# Patient Record
Sex: Male | Born: 1975 | Race: Black or African American | Hispanic: No | Marital: Married | Smoking: Never smoker
Health system: Southern US, Community
[De-identification: ages and names within clinical notes are randomized; demographics above are authoritative.]

## PROBLEM LIST (undated history)

## (undated) DIAGNOSIS — Z789 Other specified health status: Secondary | ICD-10-CM

## (undated) DIAGNOSIS — I10 Essential (primary) hypertension: Secondary | ICD-10-CM

## (undated) DIAGNOSIS — I5042 Chronic combined systolic (congestive) and diastolic (congestive) heart failure: Secondary | ICD-10-CM

## (undated) HISTORY — PX: NO PAST SURGERIES: SHX2092

---

## 2004-05-12 ENCOUNTER — Emergency Department: Payer: Self-pay | Admitting: Internal Medicine

## 2013-03-05 ENCOUNTER — Emergency Department: Payer: Self-pay | Admitting: Emergency Medicine

## 2013-03-08 LAB — BETA STREP CULTURE(ARMC)

## 2014-04-04 ENCOUNTER — Emergency Department: Payer: Self-pay | Admitting: Emergency Medicine

## 2014-05-06 ENCOUNTER — Ambulatory Visit: Payer: Self-pay | Admitting: Family Medicine

## 2017-08-10 ENCOUNTER — Emergency Department: Payer: No Typology Code available for payment source

## 2017-08-10 ENCOUNTER — Other Ambulatory Visit: Payer: Self-pay

## 2017-08-10 ENCOUNTER — Emergency Department
Admission: EM | Admit: 2017-08-10 | Discharge: 2017-08-10 | Disposition: A | Discharge status: Home / Self Care | Payer: No Typology Code available for payment source | Attending: Emergency Medicine | Admitting: Emergency Medicine

## 2017-08-10 ENCOUNTER — Encounter: Payer: Self-pay | Admitting: *Deleted

## 2017-08-10 DIAGNOSIS — Y9389 Activity, other specified: Secondary | ICD-10-CM | POA: Insufficient documentation

## 2017-08-10 DIAGNOSIS — M542 Cervicalgia: Secondary | ICD-10-CM | POA: Insufficient documentation

## 2017-08-10 DIAGNOSIS — Y999 Unspecified external cause status: Secondary | ICD-10-CM | POA: Diagnosis not present

## 2017-08-10 DIAGNOSIS — Y92414 Local residential or business street as the place of occurrence of the external cause: Secondary | ICD-10-CM | POA: Insufficient documentation

## 2017-08-10 MED ORDER — CYCLOBENZAPRINE HCL 5 MG PO TABS: ORAL_TABLET | ORAL | 0 refills | Status: DC

## 2017-08-10 MED ORDER — IBUPROFEN 600 MG PO TABS: 600.0000 mg | ORAL_TABLET | Freq: Four times a day (QID) | ORAL | 0 refills | Status: DC | PRN
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
## 2019-11-19 ENCOUNTER — Other Ambulatory Visit: Payer: Self-pay

## 2019-11-19 ENCOUNTER — Other Ambulatory Visit: Payer: Self-pay | Admitting: Radiology

## 2019-11-19 DIAGNOSIS — Z20822 Contact with and (suspected) exposure to covid-19: Secondary | ICD-10-CM

## 2019-11-20 LAB — NOVEL CORONAVIRUS, NAA: SARS-CoV-2, NAA: NOT DETECTED

## 2019-11-20 LAB — SARS-COV-2, NAA 2 DAY TAT

## 2019-11-23 ENCOUNTER — Telehealth: Payer: Self-pay

## 2019-11-23 NOTE — Telephone Encounter (Signed)
This encounter was created in error - please disregard.

## 2019-11-23 NOTE — Telephone Encounter (Signed)
Called and informed patient that test for Covid 19 was NEGATIVE. Discussed signs and symptoms of Covid 19 : fever, chills, respiratory symptoms, cough, ENT symptoms, sore throat, SOB, muscle pain, diarrhea, headache, loss of taste/smell, close exposure to COVID-19 patient. Pt instructed to call PCP if they develop the above signs and sx. Pt also instructed to call 911 if having respiratory issues/distress. Discussed MyChart enrollment. Pt verbalized understanding.  

## 2023-05-07 ENCOUNTER — Other Ambulatory Visit: Payer: Self-pay | Admitting: Family Medicine

## 2023-05-07 DIAGNOSIS — I509 Heart failure, unspecified: Secondary | ICD-10-CM

## 2023-06-04 ENCOUNTER — Ambulatory Visit
Admission: RE | Admit: 2023-06-04 | Discharge: 2023-06-04 | Disposition: A | Discharge status: Home / Self Care | Payer: No Typology Code available for payment source | Source: Ambulatory Visit | Attending: Family Medicine | Admitting: Family Medicine

## 2023-06-04 DIAGNOSIS — I509 Heart failure, unspecified: Secondary | ICD-10-CM | POA: Insufficient documentation

## 2023-06-04 NOTE — Progress Notes (Signed)
*  PRELIMINARY RESULTS* Echocardiogram 2D Echocardiogram has been performed.  Connor Blake 06/04/2023, 9:21 AM

## 2023-06-05 LAB — ECHOCARDIOGRAM COMPLETE
Est EF: <20
S' Lateral: 4.8 cm

## 2023-06-21 ENCOUNTER — Ambulatory Visit
Admission: RE | Admit: 2023-06-21 | Discharge: 2023-06-21 | Disposition: A | Discharge status: Home / Self Care | Attending: Internal Medicine | Admitting: Internal Medicine

## 2023-06-21 ENCOUNTER — Encounter
Admission: RE | Disposition: A | Discharge status: Home / Self Care | Payer: Self-pay | Source: Home / Self Care | Attending: Internal Medicine

## 2023-06-21 DIAGNOSIS — Z539 Procedure and treatment not carried out, unspecified reason: Secondary | ICD-10-CM | POA: Diagnosis not present

## 2023-06-21 DIAGNOSIS — I5023 Acute on chronic systolic (congestive) heart failure: Secondary | ICD-10-CM | POA: Insufficient documentation

## 2023-06-21 SURGERY — RIGHT/LEFT HEART CATH AND CORONARY ANGIOGRAPHY
Anesthesia: Moderate Sedation | Laterality: Bilateral

## 2023-06-21 MED ORDER — SODIUM CHLORIDE 0.9 % IV SOLN: INTRAVENOUS | Status: DC

## 2023-06-21 MED ORDER — SODIUM CHLORIDE 0.9 % IV SOLN: 250.0000 mL | INTRAVENOUS | Status: DC | PRN

## 2023-06-21 MED ORDER — SODIUM CHLORIDE 0.9% FLUSH: 3.0000 mL | INTRAVENOUS | Status: DC | PRN

## 2023-06-21 MED ORDER — SODIUM CHLORIDE 0.9% FLUSH: 3.0000 mL | Freq: Two times a day (BID) | INTRAVENOUS | Status: DC

## 2023-06-21 MED ORDER — ASPIRIN 81 MG PO CHEW: 81.0000 mg | CHEWABLE_TABLET | ORAL | Status: DC

## 2023-06-28 ENCOUNTER — Ambulatory Visit
Admission: RE | Admit: 2023-06-28 | Discharge: 2023-06-28 | Disposition: A | Discharge status: Home / Self Care | Attending: Internal Medicine | Admitting: Internal Medicine

## 2023-06-28 ENCOUNTER — Encounter
Admission: RE | Disposition: A | Discharge status: Home / Self Care | Payer: Self-pay | Source: Home / Self Care | Attending: Internal Medicine

## 2023-06-28 ENCOUNTER — Encounter: Payer: Self-pay | Admitting: Internal Medicine

## 2023-06-28 DIAGNOSIS — I428 Other cardiomyopathies: Secondary | ICD-10-CM | POA: Diagnosis not present

## 2023-06-28 DIAGNOSIS — I5020 Unspecified systolic (congestive) heart failure: Secondary | ICD-10-CM | POA: Insufficient documentation

## 2023-06-28 HISTORY — PX: LEFT HEART CATH AND CORONARY ANGIOGRAPHY: CATH118249

## 2023-06-28 HISTORY — DX: Other specified health status: Z78.9

## 2023-06-28 LAB — CARDIAC CATHETERIZATION: Cath EF Quantitative: 25 %

## 2023-06-28 SURGERY — LEFT HEART CATH AND CORONARY ANGIOGRAPHY
Anesthesia: Moderate Sedation | Laterality: Left

## 2023-06-28 MED ORDER — HEPARIN SODIUM (PORCINE) 1000 UNIT/ML IJ SOLN
INTRAMUSCULAR | Status: DC | PRN
  Administered 2023-06-28: 5000 [IU] via INTRAVENOUS

## 2023-06-28 MED ORDER — SODIUM CHLORIDE 0.9 % IV SOLN: 250.0000 mL | INTRAVENOUS | Status: DC | PRN

## 2023-06-28 MED ORDER — LABETALOL HCL 5 MG/ML IV SOLN
INTRAVENOUS | Status: AC
Start: 2023-06-28 — End: ?
  Filled 2023-06-28: qty 4

## 2023-06-28 MED ORDER — SODIUM CHLORIDE 0.9% FLUSH: 3.0000 mL | INTRAVENOUS | Status: DC | PRN

## 2023-06-28 MED ORDER — VERAPAMIL HCL 2.5 MG/ML IV SOLN
INTRAVENOUS | Status: AC
  Filled 2023-06-28: qty 2

## 2023-06-28 MED ORDER — ASPIRIN 81 MG PO CHEW
CHEWABLE_TABLET | ORAL | Status: AC
  Filled 2023-06-28: qty 1

## 2023-06-28 MED ORDER — MIDAZOLAM HCL 2 MG/2ML IJ SOLN
INTRAMUSCULAR | Status: DC | PRN
  Administered 2023-06-28: 2 mg via INTRAVENOUS

## 2023-06-28 MED ORDER — SODIUM CHLORIDE 0.9 % IV SOLN
INTRAVENOUS | Status: DC
  Administered 2023-06-28: 250 mL via INTRAVENOUS

## 2023-06-28 MED ORDER — ACETAMINOPHEN 325 MG PO TABS: 650.0000 mg | ORAL_TABLET | ORAL | Status: DC | PRN

## 2023-06-28 MED ORDER — LABETALOL HCL 5 MG/ML IV SOLN
INTRAVENOUS | Status: DC | PRN
Start: 2023-06-28 — End: 2023-06-28
  Administered 2023-06-28 (×2): 10 mg via INTRAVENOUS

## 2023-06-28 MED ORDER — LABETALOL HCL 5 MG/ML IV SOLN
10.0000 mg | INTRAVENOUS | Status: DC | PRN
  Administered 2023-06-28: 10 mg via INTRAVENOUS

## 2023-06-28 MED ORDER — MIDAZOLAM HCL 2 MG/2ML IJ SOLN
INTRAMUSCULAR | Status: AC
  Filled 2023-06-28: qty 2

## 2023-06-28 MED ORDER — SODIUM CHLORIDE 0.9% FLUSH: 3.0000 mL | Freq: Two times a day (BID) | INTRAVENOUS | Status: DC

## 2023-06-28 MED ORDER — HEPARIN (PORCINE) IN NACL 1000-0.9 UT/500ML-% IV SOLN
INTRAVENOUS | Status: AC
  Filled 2023-06-28: qty 1000

## 2023-06-28 MED ORDER — ASPIRIN 81 MG PO CHEW
81.0000 mg | CHEWABLE_TABLET | ORAL | Status: AC
  Administered 2023-06-28: 81 mg via ORAL

## 2023-06-28 MED ORDER — FENTANYL CITRATE (PF) 100 MCG/2ML IJ SOLN
INTRAMUSCULAR | Status: AC
  Filled 2023-06-28: qty 2

## 2023-06-28 MED ORDER — IOHEXOL 300 MG/ML  SOLN
INTRAMUSCULAR | Status: DC | PRN
  Administered 2023-06-28: 340 mL

## 2023-06-28 MED ORDER — VERAPAMIL HCL 2.5 MG/ML IV SOLN
INTRAVENOUS | Status: DC | PRN
  Administered 2023-06-28: 2.5 mg via INTRAVENOUS

## 2023-06-28 MED ORDER — LIDOCAINE HCL (PF) 1 % IJ SOLN
INTRAMUSCULAR | Status: DC | PRN
  Administered 2023-06-28 (×2): 5 mL

## 2023-06-28 MED ORDER — FENTANYL CITRATE (PF) 100 MCG/2ML IJ SOLN
INTRAMUSCULAR | Status: DC | PRN
  Administered 2023-06-28: 50 ug via INTRAVENOUS

## 2023-06-28 MED ORDER — HEPARIN (PORCINE) IN NACL 2000-0.9 UNIT/L-% IV SOLN
INTRAVENOUS | Status: DC | PRN
  Administered 2023-06-28: 1000 mL

## 2023-06-28 MED ORDER — HEPARIN SODIUM (PORCINE) 1000 UNIT/ML IJ SOLN
INTRAMUSCULAR | Status: AC
  Filled 2023-06-28: qty 10

## 2023-06-28 MED ORDER — ONDANSETRON HCL 4 MG/2ML IJ SOLN
4.0000 mg | Freq: Four times a day (QID) | INTRAMUSCULAR | Status: DC | PRN
Start: 2023-06-28 — End: 2023-06-28

## 2023-06-28 SURGICAL SUPPLY — 20 items
CATH INFINITI 5 FR JL3.5 (CATHETERS) IMPLANT
CATH INFINITI 5F JL4 125CM (CATHETERS) IMPLANT
CATH INFINITI 5F MPA2 125 (CATHETERS) IMPLANT
CATH INFINITI 5FR JL5 (CATHETERS) IMPLANT
CATH INFINITI 5FR MULTPACK ANG (CATHETERS) IMPLANT
DEVICE RAD COMP TR BAND LRG (VASCULAR PRODUCTS) IMPLANT
DRAPE BRACHIAL (DRAPES) IMPLANT
GLIDESHEATH SLEND SS 6F .021 (SHEATH) IMPLANT
GUIDEWIRE INQWIRE 1.5J.035X260 (WIRE) IMPLANT
INQWIRE 1.5J .035X260CM (WIRE) ×1 IMPLANT
NDL PERC 18GX7CM (NEEDLE) IMPLANT
NEEDLE PERC 18GX7CM (NEEDLE) ×1 IMPLANT
PACK CARDIAC CATH (CUSTOM PROCEDURE TRAY) ×1 IMPLANT
PANNUS RETENTION SYSTEM 2 PAD (MISCELLANEOUS) IMPLANT
PROTECTION STATION PRESSURIZED (MISCELLANEOUS) ×1 IMPLANT
SET ATX-X65L (MISCELLANEOUS) IMPLANT
SHEATH 6FR 85 DEST SLENDER (SHEATH) IMPLANT
SHEATH AVANTI 5FR X 11CM (SHEATH) IMPLANT
STATION PROTECTION PRESSURIZED (MISCELLANEOUS) IMPLANT
WIRE GUIDERIGHT .035X150 (WIRE) IMPLANT

## 2023-06-28 NOTE — Progress Notes (Signed)
 Patient remains clinically stable, with improvement with bp post prn iv meds given. Denies complaints at this time. Wife and daughter with patient with discharge instructions given. Questions answered. No bleeding nor hematoma at right groin nor right radial site at this time. TR band off with dressing applied.

## 2023-06-29 ENCOUNTER — Encounter: Payer: Self-pay | Admitting: Internal Medicine

## 2023-07-03 ENCOUNTER — Telehealth (HOSPITAL_COMMUNITY): Payer: Self-pay

## 2023-07-03 NOTE — Telephone Encounter (Signed)
 Referral is for [facility], not [facility] location. Directed patient to call [facility] regarding his referral.

## 2023-07-03 NOTE — Telephone Encounter (Signed)
 Patient called asking for status of referral. Advised patient we have not fully processed his referral yet and we will be in contact soon. Patient stated he was hoping to come in for orientation this week before he has to start work next week. Advised patient the nurse navigator needs to review his chart for any necessary follow-ups or information we may need to request from him provider.  Went over program with patient, he stated he is very interested in starting.

## 2023-07-04 ENCOUNTER — Other Ambulatory Visit: Payer: Self-pay

## 2023-07-04 ENCOUNTER — Encounter: Attending: Internal Medicine

## 2023-07-04 DIAGNOSIS — Z713 Dietary counseling and surveillance: Secondary | ICD-10-CM | POA: Insufficient documentation

## 2023-07-04 DIAGNOSIS — I5040 Unspecified combined systolic (congestive) and diastolic (congestive) heart failure: Secondary | ICD-10-CM | POA: Insufficient documentation

## 2023-07-04 NOTE — Progress Notes (Signed)
 Virtual Visit completed. Patient informed on EP and RD appointment and 6 Minute walk test. Patient also informed of patient health questionnaires on My Chart. Patient Verbalizes understanding. Visit diagnosis can be found in [facility] 06/12/2023.

## 2023-07-09 ENCOUNTER — Other Ambulatory Visit: Payer: Self-pay | Admitting: Nurse Practitioner

## 2023-07-09 ENCOUNTER — Ambulatory Visit
Admission: RE | Admit: 2023-07-09 | Discharge: 2023-07-09 | Disposition: A | Discharge status: Home / Self Care | Source: Ambulatory Visit | Attending: Nurse Practitioner | Admitting: Nurse Practitioner

## 2023-07-09 ENCOUNTER — Encounter: Payer: Self-pay | Admitting: Nurse Practitioner

## 2023-07-09 DIAGNOSIS — R1909 Other intra-abdominal and pelvic swelling, mass and lump: Secondary | ICD-10-CM | POA: Diagnosis present

## 2023-07-09 DIAGNOSIS — R58 Hemorrhage, not elsewhere classified: Secondary | ICD-10-CM | POA: Diagnosis present

## 2023-07-09 DIAGNOSIS — Z9889 Other specified postprocedural states: Secondary | ICD-10-CM

## 2023-07-10 ENCOUNTER — Other Ambulatory Visit: Payer: Self-pay | Admitting: Nurse Practitioner

## 2023-07-10 ENCOUNTER — Other Ambulatory Visit: Payer: Self-pay | Admitting: Cardiology

## 2023-07-10 ENCOUNTER — Ambulatory Visit
Admission: RE | Admit: 2023-07-10 | Discharge: 2023-07-10 | Disposition: A | Discharge status: Home / Self Care | Source: Ambulatory Visit | Attending: Nurse Practitioner | Admitting: Nurse Practitioner

## 2023-07-10 DIAGNOSIS — Z9889 Other specified postprocedural states: Secondary | ICD-10-CM | POA: Diagnosis present

## 2023-07-10 DIAGNOSIS — R1909 Other intra-abdominal and pelvic swelling, mass and lump: Secondary | ICD-10-CM

## 2023-07-10 DIAGNOSIS — R936 Abnormal findings on diagnostic imaging of limbs: Secondary | ICD-10-CM

## 2023-07-10 DIAGNOSIS — I724 Aneurysm of artery of lower extremity: Secondary | ICD-10-CM | POA: Diagnosis present

## 2023-07-10 DIAGNOSIS — R58 Hemorrhage, not elsewhere classified: Secondary | ICD-10-CM | POA: Insufficient documentation

## 2023-07-10 DIAGNOSIS — I5020 Unspecified systolic (congestive) heart failure: Secondary | ICD-10-CM

## 2023-07-10 MED ORDER — IOHEXOL 350 MG/ML SOLN
125.0000 mL | Freq: Once | INTRAVENOUS | Status: AC | PRN
  Administered 2023-07-10: 125 mL via INTRAVENOUS

## 2023-07-12 ENCOUNTER — Encounter

## 2023-07-12 VITALS — Ht 73.62 in | Wt >= 6400 oz

## 2023-07-12 DIAGNOSIS — I5040 Unspecified combined systolic (congestive) and diastolic (congestive) heart failure: Secondary | ICD-10-CM

## 2023-07-12 DIAGNOSIS — Z713 Dietary counseling and surveillance: Secondary | ICD-10-CM | POA: Diagnosis not present

## 2023-07-12 NOTE — Patient Instructions (Signed)
 Patient Instructions  Patient Details  Name: Connor Blake MRN: [id number] Date of Birth: 06/11/1975 Referring Provider:  Alwyn Pea, MD  Below are your personal goals for exercise, nutrition, and risk factors. Our goal is to help you stay on track towards obtaining and maintaining these goals. We will be discussing your progress on these goals with you throughout the program.  Initial Exercise Prescription:  Initial Exercise Prescription - 07/12/23 1600       Date of Initial Exercise RX and Referring Provider   Date 07/12/23    Referring Provider Windell Norfolk, MD      Oxygen   Maintain Oxygen Saturation 88% or higher      NuStep   Level 2   T6   SPM 80    Minutes 15    METs 2.52      Arm Ergometer   Level 1    RPM 30    Minutes 15    METs 2.52      T5 Nustep   Level 2    SPM 80    Minutes 15    METs 2.52      Biostep-RELP   Level 2    SPM 50    Minutes 15    METs 2.52      Track   Laps 28    Minutes 15    METs 2.52      Prescription Details   Frequency (times per week) 2    Duration Progress to 30 minutes of continuous aerobic without signs/symptoms of physical distress      Intensity   THRR 40-80% of Max Heartrate 119-156    Ratings of Perceived Exertion 11-13    Perceived Dyspnea 0-4      Progression   Progression Continue to progress workloads to maintain intensity without signs/symptoms of physical distress.      Resistance Training   Training Prescription Yes    Weight 5 lb    Reps 10-15             Exercise Goals: Frequency: Be able to perform aerobic exercise two to three times per week in program working toward 2-5 days per week of home exercise.  Intensity: Work with a perceived exertion of 11 (fairly light) - 15 (hard) while following your exercise prescription.  We will make changes to your prescription with you as you progress through the program.   Duration: Be able to do 30 to 45 minutes of continuous aerobic  exercise in addition to a 5 minute warm-up and a 5 minute cool-down routine.   Nutrition Goals: Your personal nutrition goals will be established when you do your nutrition analysis with the dietician.  The following are general nutrition guidelines to follow: Cholesterol < 200mg /day Sodium < 1500mg /day Fiber: Men under 50 yrs - 38 grams per day  Personal Goals:  Personal Goals and Risk Factors at Admission - 07/12/23 1637       Core Components/Risk Factors/Patient Goals on Admission    Weight Management Yes;Weight Loss;Obesity    Intervention Weight Management: Develop a combined nutrition and exercise program designed to reach desired caloric intake, while maintaining appropriate intake of nutrient and fiber, sodium and fats, and appropriate energy expenditure required for the weight goal.;Weight Management: Provide education and appropriate resources to help participant work on and attain dietary goals.;Weight Management/Obesity: Establish reasonable short term and long term weight goals.;Obesity: Provide education and appropriate resources to help participant work on and attain dietary goals.  Admit Weight 485 lb 14.4 oz (220.4 kg)    Goal Weight: Short Term 475 lb 14.4 oz (215.9 kg)    Goal Weight: Long Term 465 lb 14.4 oz (211.3 kg)    Expected Outcomes Short Term: Continue to assess and modify interventions until short term weight is achieved;Weight Loss: Understanding of general recommendations for a balanced deficit meal plan, which promotes 1-2 lb weight loss per week and includes a negative energy balance of [id number] kcal/d;Understanding recommendations for meals to include 15-35% energy as protein, 25-35% energy from fat, 35-60% energy from carbohydrates, less than 200mg  of dietary cholesterol, 20-35 gm of total fiber daily;Understanding of distribution of calorie intake throughout the day with the consumption of 4-5 meals/snacks;Long Term: Adherence to nutrition and physical  activity/exercise program aimed toward attainment of established weight goal    Heart Failure Yes    Intervention Provide a combined exercise and nutrition program that is supplemented with education, support and counseling about heart failure. Directed toward relieving symptoms such as shortness of breath, decreased exercise tolerance, and extremity edema.    Expected Outcomes Improve functional capacity of life;Short term: Attendance in program 2-3 days a week with increased exercise capacity. Reported lower sodium intake. Reported increased fruit and vegetable intake. Reports medication compliance.;Short term: Daily weights obtained and reported for increase. Utilizing diuretic protocols set by physician.;Long term: Adoption of self-care skills and reduction of barriers for early signs and symptoms recognition and intervention leading to self-care maintenance.             Tobacco Use Initial Evaluation: Social History   Tobacco Use  Smoking Status Never  Smokeless Tobacco Never    Exercise Goals and Review:  Exercise Goals     Row Name 07/12/23 1636             Exercise Goals   Increase Physical Activity Yes       Intervention Provide advice, education, support and counseling about physical activity/exercise needs.;Develop an individualized exercise prescription for aerobic and resistive training based on initial evaluation findings, risk stratification, comorbidities and participant's personal goals.       Expected Outcomes Short Term: Attend rehab on a regular basis to increase amount of physical activity.;Long Term: Add in home exercise to make exercise part of routine and to increase amount of physical activity.;Long Term: Exercising regularly at least 3-5 days a week.       Increase Strength and Stamina Yes       Intervention Provide advice, education, support and counseling about physical activity/exercise needs.;Develop an individualized exercise prescription for aerobic  and resistive training based on initial evaluation findings, risk stratification, comorbidities and participant's personal goals.       Expected Outcomes Short Term: Increase workloads from initial exercise prescription for resistance, speed, and METs.;Short Term: Perform resistance training exercises routinely during rehab and add in resistance training at home;Long Term: Improve cardiorespiratory fitness, muscular endurance and strength as measured by increased METs and functional capacity ( )       Able to understand and use rate of perceived exertion (RPE) scale Yes       Intervention Provide education and explanation on how to use RPE scale       Expected Outcomes Short Term: Able to use RPE daily in rehab to express subjective intensity level;Long Term:  Able to use RPE to guide intensity level when exercising independently       Able to understand and use Dyspnea scale Yes  Intervention Provide education and explanation on how to use Dyspnea scale       Expected Outcomes Short Term: Able to use Dyspnea scale daily in rehab to express subjective sense of shortness of breath during exertion;Long Term: Able to use Dyspnea scale to guide intensity level when exercising independently       Knowledge and understanding of Target Heart Rate Range (THRR) Yes       Intervention Provide education and explanation of THRR including how the numbers were predicted and where they are located for reference       Expected Outcomes Short Term: Able to state/look up THRR;Short Term: Able to use daily as guideline for intensity in rehab;Long Term: Able to use THRR to govern intensity when exercising independently       Able to check pulse independently Yes       Intervention Provide education and demonstration on how to check pulse in carotid and radial arteries.;Review the importance of being able to check your own pulse for safety during independent exercise       Expected Outcomes Short Term: Able to  explain why pulse checking is important during independent exercise;Long Term: Able to check pulse independently and accurately       Understanding of Exercise Prescription Yes       Intervention Provide education, explanation, and written materials on patient's individual exercise prescription       Expected Outcomes Short Term: Able to explain program exercise prescription;Long Term: Able to explain home exercise prescription to exercise independently

## 2023-07-12 NOTE — Progress Notes (Signed)
 Cardiac Individual Treatment Plan  Patient Details  Name: Connor Blake MRN: [id number] Date of Birth: 02/23/1976 Referring Provider:   Flowsheet Row Cardiac Rehab from 07/12/2023 in [facility] Cardiac and Pulmonary Rehab  Referring Provider Joetta Mustache, MD       Initial Encounter Date:  Flowsheet Row Cardiac Rehab from 07/12/2023 in [facility] Cardiac and Pulmonary Rehab  Date 07/12/23       Visit Diagnosis: Combined systolic and diastolic congestive heart failure, unspecified HF chronicity (HCC)  Patient's Home Medications on Admission:  Current Outpatient Medications:    carvedilol (COREG) 3.125 MG tablet, Take 3.125 mg by mouth 2 (two) times daily., Disp: , Rfl:    carvedilol (COREG) 6.25 MG tablet, Take 6.25 mg by mouth 2 (two) times daily. (Patient not taking: Reported on 07/04/2023), Disp: , Rfl:    furosemide (LASIX) 40 MG tablet, Take 1 tablet by mouth 2 (two) times daily., Disp: , Rfl:    meloxicam (MOBIC) 15 MG tablet, Take 15 mg by mouth daily as needed for pain., Disp: , Rfl:    prednisoLONE acetate (PRED FORTE) 1 % ophthalmic suspension, Place 1 drop into the left eye daily. (Patient not taking: Reported on 07/04/2023), Disp: , Rfl:    sacubitril-valsartan (ENTRESTO) 24-26 MG, Take 1 tablet by mouth 2 (two) times daily., Disp: , Rfl:   Past Medical History: Past Medical History:  Diagnosis Date   Medical history non-contributory     Tobacco Use: Social History   Tobacco Use  Smoking Status Never  Smokeless Tobacco Never    Labs: Review Flowsheet        No data to display           Exercise Target Goals: Exercise Program Goal: Individual exercise prescription set using results from initial 6 min walk test and THRR while considering  patient's activity barriers and safety.   Exercise Prescription Goal: Initial exercise prescription builds to 30-45 minutes a day of aerobic activity, 2-3 days per week.  Home exercise guidelines will be given to patient  during program as part of exercise prescription that the participant will acknowledge.   Education: Aerobic Exercise: - Group verbal and visual presentation on the components of exercise prescription. Introduces F.I.T.T principle from ACSM for exercise prescriptions.  Reviews F.I.T.T. principles of aerobic exercise including progression. Written material given at graduation.   Education: Resistance Exercise: - Group verbal and visual presentation on the components of exercise prescription. Introduces F.I.T.T principle from ACSM for exercise prescriptions  Reviews F.I.T.T. principles of resistance exercise including progression. Written material given at graduation.    Education: Exercise & Equipment Safety: - Individual verbal instruction and demonstration of equipment use and safety with use of the equipment. Flowsheet Row Cardiac Rehab from 07/12/2023 in [facility] Cardiac and Pulmonary Rehab  Date 07/12/23  Educator MB  Instruction Review Code 1- Verbalizes Understanding       Education: Exercise Physiology & General Exercise Guidelines: - Group verbal and written instruction with models to review the exercise physiology of the cardiovascular system and associated critical values. Provides general exercise guidelines with specific guidelines to those with heart or lung disease.    Education: Flexibility, Balance, Mind/Body Relaxation: - Group verbal and visual presentation with interactive activity on the components of exercise prescription. Introduces F.I.T.T principle from ACSM for exercise prescriptions. Reviews F.I.T.T. principles of flexibility and balance exercise training including progression. Also discusses the mind body connection.  Reviews various relaxation techniques to help reduce and manage stress (i.e. Deep breathing, progressive  muscle relaxation, and visualization). Balance handout provided to take home. Written material given at graduation.   Activity Barriers & Risk  Stratification:  Activity Barriers & Cardiac Risk Stratification - 07/12/23 1632       Activity Barriers & Cardiac Risk Stratification   Activity Barriers None    Cardiac Risk Stratification High             6 Minute Walk:  6 Minute Walk     Row Name 07/12/23 1631         6 Minute Walk   Phase Initial     Distance 1155 feet     Walk Time 6 minutes     # of Rest Breaks 0     MPH 2.19     METS 2.52     RPE 14     Perceived Dyspnea  2     VO2 Peak 8.82     Symptoms No     Resting HR 83 bpm     Resting BP 148/88     Resting Oxygen Saturation  96 %     Exercise Oxygen Saturation  during 6 min walk 99 %     Max Ex. HR 126 bpm     Max Ex. BP 200/100     2 Minute Post BP 150/90              Oxygen Initial Assessment:   Oxygen Re-Evaluation:   Oxygen Discharge (Final Oxygen Re-Evaluation):   Initial Exercise Prescription:  Initial Exercise Prescription - 07/12/23 1600       Date of Initial Exercise RX and Referring Provider   Date 07/12/23    Referring Provider Windell Norfolk, MD      Oxygen   Maintain Oxygen Saturation 88% or higher      NuStep   Level 2   T6   SPM 80    Minutes 15    METs 2.52      Arm Ergometer   Level 1    RPM 30    Minutes 15    METs 2.52      T5 Nustep   Level 2    SPM 80    Minutes 15    METs 2.52      Biostep-RELP   Level 2    SPM 50    Minutes 15    METs 2.52      Track   Laps 28    Minutes 15    METs 2.52      Prescription Details   Frequency (times per week) 2    Duration Progress to 30 minutes of continuous aerobic without signs/symptoms of physical distress      Intensity   THRR 40-80% of Max Heartrate 119-156    Ratings of Perceived Exertion 11-13    Perceived Dyspnea 0-4      Progression   Progression Continue to progress workloads to maintain intensity without signs/symptoms of physical distress.      Resistance Training   Training Prescription Yes    Weight 5 lb    Reps 10-15              Perform Capillary Blood Glucose checks as needed.  Exercise Prescription Changes:   Exercise Prescription Changes     Row Name 07/12/23 1600             Response to Exercise   Blood Pressure (Admit) 148/88       Blood Pressure (Exercise) 200/100  Blood Pressure (Exit) 150/90       Heart Rate (Admit) 83 bpm       Heart Rate (Exercise) 126 bpm       Heart Rate (Exit) 82 bpm       Oxygen Saturation (Admit) 96 %       Oxygen Saturation (Exercise) 99 %       Oxygen Saturation (Exit) 96 %       Rating of Perceived Exertion (Exercise) 14       Perceived Dyspnea (Exercise) 2       Symptoms none       Comments results       Duration Progress to 30 minutes of  aerobic without signs/symptoms of physical distress       Intensity THRR New         Progression   Progression Continue to progress workloads to maintain intensity without signs/symptoms of physical distress.       Average METs 2.52                Exercise Comments:   Exercise Goals and Review:   Exercise Goals     Row Name 07/12/23 1636             Exercise Goals   Increase Physical Activity Yes       Intervention Provide advice, education, support and counseling about physical activity/exercise needs.;Develop an individualized exercise prescription for aerobic and resistive training based on initial evaluation findings, risk stratification, comorbidities and participant's personal goals.       Expected Outcomes Short Term: Attend rehab on a regular basis to increase amount of physical activity.;Long Term: Add in home exercise to make exercise part of routine and to increase amount of physical activity.;Long Term: Exercising regularly at least 3-5 days a week.       Increase Strength and Stamina Yes       Intervention Provide advice, education, support and counseling about physical activity/exercise needs.;Develop an individualized exercise prescription for aerobic and resistive training  based on initial evaluation findings, risk stratification, comorbidities and participant's personal goals.       Expected Outcomes Short Term: Increase workloads from initial exercise prescription for resistance, speed, and METs.;Short Term: Perform resistance training exercises routinely during rehab and add in resistance training at home;Long Term: Improve cardiorespiratory fitness, muscular endurance and strength as measured by increased METs and functional capacity ( )       Able to understand and use rate of perceived exertion (RPE) scale Yes       Intervention Provide education and explanation on how to use RPE scale       Expected Outcomes Short Term: Able to use RPE daily in rehab to express subjective intensity level;Long Term:  Able to use RPE to guide intensity level when exercising independently       Able to understand and use Dyspnea scale Yes       Intervention Provide education and explanation on how to use Dyspnea scale       Expected Outcomes Short Term: Able to use Dyspnea scale daily in rehab to express subjective sense of shortness of breath during exertion;Long Term: Able to use Dyspnea scale to guide intensity level when exercising independently       Knowledge and understanding of Target Heart Rate Range (THRR) Yes       Intervention Provide education and explanation of THRR including how the numbers were predicted and where they are located for reference  Expected Outcomes Short Term: Able to state/look up THRR;Short Term: Able to use daily as guideline for intensity in rehab;Long Term: Able to use THRR to govern intensity when exercising independently       Able to check pulse independently Yes       Intervention Provide education and demonstration on how to check pulse in carotid and radial arteries.;Review the importance of being able to check your own pulse for safety during independent exercise       Expected Outcomes Short Term: Able to explain why pulse checking  is important during independent exercise;Long Term: Able to check pulse independently and accurately       Understanding of Exercise Prescription Yes       Intervention Provide education, explanation, and written materials on patient's individual exercise prescription       Expected Outcomes Short Term: Able to explain program exercise prescription;Long Term: Able to explain home exercise prescription to exercise independently                Exercise Goals Re-Evaluation :   Discharge Exercise Prescription (Final Exercise Prescription Changes):  Exercise Prescription Changes - 07/12/23 1600       Response to Exercise   Blood Pressure (Admit) 148/88    Blood Pressure (Exercise) 200/100    Blood Pressure (Exit) 150/90    Heart Rate (Admit) 83 bpm    Heart Rate (Exercise) 126 bpm    Heart Rate (Exit) 82 bpm    Oxygen Saturation (Admit) 96 %    Oxygen Saturation (Exercise) 99 %    Oxygen Saturation (Exit) 96 %    Rating of Perceived Exertion (Exercise) 14    Perceived Dyspnea (Exercise) 2    Symptoms none    Comments results    Duration Progress to 30 minutes of  aerobic without signs/symptoms of physical distress    Intensity THRR New      Progression   Progression Continue to progress workloads to maintain intensity without signs/symptoms of physical distress.    Average METs 2.52             Nutrition:  Target Goals: Understanding of nutrition guidelines, daily intake of sodium 1500mg , cholesterol 200mg , calories 30% from fat and 7% or less from saturated fats, daily to have 5 or more servings of fruits and vegetables.  Education: All About Nutrition: -Group instruction provided by verbal, written material, interactive activities, discussions, models, and posters to present general guidelines for heart healthy nutrition including fat, fiber, MyPlate, the role of sodium in heart healthy nutrition, utilization of the nutrition label, and utilization of this  knowledge for meal planning. Follow up email sent as well. Written material given at graduation.   Biometrics:  Pre Biometrics - 07/12/23 1636       Pre Biometrics   Height 6' 1.62" (1.87 m)    Weight 485 lb 14.4 oz (220.4 kg)    Waist Circumference 63 inches    Hip Circumference 69 inches    Waist to Hip Ratio 0.91 %    BMI (Calculated) 63.03    Single Leg Stand 5.3 seconds              Nutrition Therapy Plan and Nutrition Goals:  Nutrition Therapy & Goals - 07/12/23 1637       Personal Nutrition Goals   Nutrition Goal Will meet with RD on 4/28      Intervention Plan   Intervention Prescribe, educate and counsel regarding individualized specific dietary modifications  aiming towards targeted core components such as weight, hypertension, lipid management, diabetes, heart failure and other comorbidities.;Nutrition handout(s) given to patient.    Expected Outcomes Short Term Goal: Understand basic principles of dietary content, such as calories, fat, sodium, cholesterol and nutrients.;Short Term Goal: A plan has been developed with personal nutrition goals set during dietitian appointment.;Long Term Goal: Adherence to prescribed nutrition plan.             Nutrition Assessments:  MEDIFICTS Score Key: >=70 Need to make dietary changes  40-70 Heart Healthy Diet <= 40 Therapeutic Level Cholesterol Diet   Picture Your Plate Scores: <29 Unhealthy dietary pattern with much room for improvement. 41-50 Dietary pattern unlikely to meet recommendations for good health and room for improvement. 51-60 More healthful dietary pattern, with some room for improvement.  >60 Healthy dietary pattern, although there may be some specific behaviors that could be improved.    Nutrition Goals Re-Evaluation:   Nutrition Goals Discharge (Final Nutrition Goals Re-Evaluation):   Psychosocial: Target Goals: Acknowledge presence or absence of significant depression and/or stress,  maximize coping skills, provide positive support system. Participant is able to verbalize types and ability to use techniques and skills needed for reducing stress and depression.   Education: Stress, Anxiety, and Depression - Group verbal and visual presentation to define topics covered.  Reviews how body is impacted by stress, anxiety, and depression.  Also discusses healthy ways to reduce stress and to treat/manage anxiety and depression.  Written material given at graduation.   Education: Sleep Hygiene -Provides group verbal and written instruction about how sleep can affect your health.  Define sleep hygiene, discuss sleep cycles and impact of sleep habits. Review good sleep hygiene tips.    Initial Review & Psychosocial Screening:  Initial Psych Review & Screening - 07/04/23 1456       Initial Review   Current issues with None Identified      Family Dynamics   Good Support System? Yes    Comments He can look to his daughter, mother, wife and everyone in his family. Patient does not take anything for his mental state.      Barriers   Psychosocial barriers to participate in program There are no identifiable barriers or psychosocial needs.;The patient should benefit from training in stress management and relaxation.      Screening Interventions   Interventions Provide feedback about the scores to participant;To provide support and resources with identified psychosocial needs;Encouraged to exercise    Expected Outcomes Short Term goal: Utilizing psychosocial counselor, staff and physician to assist with identification of specific Stressors or current issues interfering with healing process. Setting desired goal for each stressor or current issue identified.;Long Term Goal: Stressors or current issues are controlled or eliminated.;Short Term goal: Identification and review with participant of any Quality of Life or Depression concerns found by scoring the questionnaire.;Long Term goal: The  participant improves quality of Life and PHQ9 Scores as seen by post scores and/or verbalization of changes             Quality of Life Scores:   Scores of 19 and below usually indicate a poorer quality of life in these areas.  A difference of  2-3 points is a clinically meaningful difference.  A difference of 2-3 points in the total score of the Quality of Life Index has been associated with significant improvement in overall quality of life, self-image, physical symptoms, and general health in studies assessing change in quality of life.  PHQ-9: Review Flowsheet       07/12/2023  Depression screen PHQ 2/9  Decreased Interest 1  Down, Depressed, Hopeless 0  PHQ - 2 Score 1  Altered sleeping 2  Tired, decreased energy 2  Change in appetite 0  Feeling bad or failure about yourself  1  Trouble concentrating 0  Moving slowly or fidgety/restless 0  Suicidal thoughts 0  PHQ-9 Score 6  Difficult doing work/chores Somewhat difficult   Interpretation of Total Score  Total Score Depression Severity:  1-4 = Minimal depression, 5-9 = Mild depression, 10-14 = Moderate depression, 15-19 = Moderately severe depression, 20-27 = Severe depression   Psychosocial Evaluation and Intervention:  Psychosocial Evaluation - 07/04/23 1457       Psychosocial Evaluation & Interventions   Interventions Relaxation education;Stress management education;Encouraged to exercise with the program and follow exercise prescription    Comments He can look to his daughter, mother, wife and everyone in his family. Patient does not take anything for his mental state.    Expected Outcomes Short: Start HeartTrack to help with mood. Long: Maintain a healthy mental state    Continue Psychosocial Services  Follow up required by staff             Psychosocial Re-Evaluation:   Psychosocial Discharge (Final Psychosocial Re-Evaluation):   Vocational Rehabilitation: Provide vocational rehab assistance to  qualifying candidates.   Vocational Rehab Evaluation & Intervention:   Education: Education Goals: Education classes will be provided on a variety of topics geared toward better understanding of heart health and risk factor modification. Participant will state understanding/return demonstration of topics presented as noted by education test scores.  Learning Barriers/Preferences:  Learning Barriers/Preferences - 07/04/23 1455       Learning Barriers/Preferences   Learning Barriers Sight    Learning Preferences None             General Cardiac Education Topics:  AED/CPR: - Group verbal and written instruction with the use of models to demonstrate the basic use of the AED with the basic ABC's of resuscitation.   Anatomy and Cardiac Procedures: - Group verbal and visual presentation and models provide information about basic cardiac anatomy and function. Reviews the testing methods done to diagnose heart disease and the outcomes of the test results. Describes the treatment choices: Medical Management, Angioplasty, or Coronary Bypass Surgery for treating various heart conditions including Myocardial Infarction, Angina, Valve Disease, and Cardiac Arrhythmias.  Written material given at graduation.   Medication Safety: - Group verbal and visual instruction to review commonly prescribed medications for heart and lung disease. Reviews the medication, class of the drug, and side effects. Includes the steps to properly store meds and maintain the prescription regimen.  Written material given at graduation.   Intimacy: - Group verbal instruction through game format to discuss how heart and lung disease can affect sexual intimacy. Written material given at graduation..   Know Your Numbers and Heart Failure: - Group verbal and visual instruction to discuss disease risk factors for cardiac and pulmonary disease and treatment options.  Reviews associated critical values for  Overweight/Obesity, Hypertension, Cholesterol, and Diabetes.  Discusses basics of heart failure: signs/symptoms and treatments.  Introduces Heart Failure Zone chart for action plan for heart failure.  Written material given at graduation.   Infection Prevention: - Provides verbal and written material to individual with discussion of infection control including proper hand washing and proper equipment cleaning during exercise session. Flowsheet Row Cardiac Rehab from 07/12/2023 in [facility]  Cardiac and Pulmonary Rehab  Date 07/12/23  Educator MB  Instruction Review Code 1- Verbalizes Understanding       Falls Prevention: - Provides verbal and written material to individual with discussion of falls prevention and safety. Flowsheet Row Cardiac Rehab from 07/12/2023 in [facility] Cardiac and Pulmonary Rehab  Date 07/12/23  Educator MB  Instruction Review Code 1- Verbalizes Understanding       Other: -Provides group and verbal instruction on various topics (see comments)   Knowledge Questionnaire Score:   Core Components/Risk Factors/Patient Goals at Admission:  Personal Goals and Risk Factors at Admission - 07/12/23 1637       Core Components/Risk Factors/Patient Goals on Admission    Weight Management Yes;Weight Loss;Obesity    Intervention Weight Management: Develop a combined nutrition and exercise program designed to reach desired caloric intake, while maintaining appropriate intake of nutrient and fiber, sodium and fats, and appropriate energy expenditure required for the weight goal.;Weight Management: Provide education and appropriate resources to help participant work on and attain dietary goals.;Weight Management/Obesity: Establish reasonable short term and long term weight goals.;Obesity: Provide education and appropriate resources to help participant work on and attain dietary goals.    Admit Weight 485 lb 14.4 oz (220.4 kg)    Goal Weight: Short Term 475 lb 14.4 oz (215.9 kg)     Goal Weight: Long Term 465 lb 14.4 oz (211.3 kg)    Expected Outcomes Short Term: Continue to assess and modify interventions until short term weight is achieved;Weight Loss: Understanding of general recommendations for a balanced deficit meal plan, which promotes 1-2 lb weight loss per week and includes a negative energy balance of [phone] kcal/d;Understanding recommendations for meals to include 15-35% energy as protein, 25-35% energy from fat, 35-60% energy from carbohydrates, less than 200mg  of dietary cholesterol, 20-35 gm of total fiber daily;Understanding of distribution of calorie intake throughout the day with the consumption of 4-5 meals/snacks;Long Term: Adherence to nutrition and physical activity/exercise program aimed toward attainment of established weight goal    Heart Failure Yes    Intervention Provide a combined exercise and nutrition program that is supplemented with education, support and counseling about heart failure. Directed toward relieving symptoms such as shortness of breath, decreased exercise tolerance, and extremity edema.    Expected Outcomes Improve functional capacity of life;Short term: Attendance in program 2-3 days a week with increased exercise capacity. Reported lower sodium intake. Reported increased fruit and vegetable intake. Reports medication compliance.;Short term: Daily weights obtained and reported for increase. Utilizing diuretic protocols set by physician.;Long term: Adoption of self-care skills and reduction of barriers for early signs and symptoms recognition and intervention leading to self-care maintenance.             Education:Diabetes - Individual verbal and written instruction to review signs/symptoms of diabetes, desired ranges of glucose level fasting, after meals and with exercise. Acknowledge that pre and post exercise glucose checks will be done for 3 sessions at entry of program.   Core Components/Risk Factors/Patient Goals Review:     Core Components/Risk Factors/Patient Goals at Discharge (Final Review):    ITP Comments:  ITP Comments     Row Name 07/04/23 1454 07/12/23 1630         ITP Comments Virtual Visit completed. Patient informed on EP and RD appointment and 6 Minute walk test. Patient also informed of patient health questionnaires on My Chart. Patient Verbalizes understanding. Visit diagnosis can be found in [facility] 06/12/2023. Completed and gym orientation  for cardiac rehab. Initial ITP created and sent for review to Dr. Firman Hughes, Medical Director.               Comments: Initial ITP

## 2023-07-16 ENCOUNTER — Encounter: Admitting: *Deleted

## 2023-07-16 DIAGNOSIS — I5040 Unspecified combined systolic (congestive) and diastolic (congestive) heart failure: Secondary | ICD-10-CM

## 2023-07-16 NOTE — Progress Notes (Signed)
 Daily Session Note  Patient Details  Name: Connor Blake MRN: [id number] Date of Birth: 1975/07/17 Referring Provider:   Flowsheet Row Cardiac Rehab from 07/12/2023 in [facility] Cardiac and Pulmonary Rehab  Referring Provider Joetta Mustache, MD       Encounter Date: 07/16/2023  Check In:  Session Check In - 07/16/23 1716       Check-In   Supervising physician immediately available to respond to emergencies See telemetry face sheet for immediately available ER MD    Location [facility]-Cardiac & Pulmonary Rehab    Staff Present Sue Em RN,BSN;Joseph Nancey Awkward;Maud Sorenson, RN, BSN, [employer], ACSM CEP, Exercise Physiologist    Virtual Visit No    Medication changes reported     No    Fall or balance concerns reported    No    Warm-up and Cool-down Performed on first and last piece of equipment    Resistance Training Performed Yes    VAD Patient? No    PAD/SET Patient? No      Pain Assessment   Currently in Pain? No/denies                Social History   Tobacco Use  Smoking Status Never  Smokeless Tobacco Never    Goals Met:  Independence with exercise equipment Exercise tolerated well No report of concerns or symptoms today Strength training completed today  Goals Unmet:  Not Applicable  Comments: First full day of exercise!  Patient was oriented to gym and equipment including functions, settings, policies, and procedures.  Patient's individual exercise prescription and treatment plan were reviewed.  All starting workloads were established based on the results of the 6 minute walk test done at initial orientation visit.  The plan for exercise progression was also introduced and progression will be customized based on patient's performance and goals.     Dr. Firman Hughes is Medical Director for [facility] Cardiac Rehabilitation.  Dr. Fuad Aleskerov is Medical Director for [facility] Pulmonary Rehabilitation.

## 2023-07-18 ENCOUNTER — Ambulatory Visit
Admission: RE | Admit: 2023-07-18 | Discharge: 2023-07-18 | Disposition: A | Discharge status: Home / Self Care | Source: Ambulatory Visit | Attending: Cardiology | Admitting: Cardiology

## 2023-07-18 ENCOUNTER — Encounter: Admitting: *Deleted

## 2023-07-18 DIAGNOSIS — I5020 Unspecified systolic (congestive) heart failure: Secondary | ICD-10-CM

## 2023-07-18 DIAGNOSIS — I5040 Unspecified combined systolic (congestive) and diastolic (congestive) heart failure: Secondary | ICD-10-CM | POA: Diagnosis not present

## 2023-07-18 NOTE — Progress Notes (Signed)
 Daily Session Note  Patient Details  Name: Connor Blake MRN: [id number] Date of Birth: 30-Mar-1975 Referring Provider:   Flowsheet Row Cardiac Rehab from 07/12/2023 in [facility] Cardiac and Pulmonary Rehab  Referring Provider Joetta Mustache, MD       Encounter Date: 07/18/2023  Check In:  Session Check In - 07/18/23 1650       Check-In   Supervising physician immediately available to respond to emergencies See telemetry face sheet for immediately available ER MD    Location [facility]-Cardiac & Pulmonary Rehab    Staff Present Sue Em RN,BSN;Susanne Bice, RN, BSN, CCRP;Joseph Hood RCP,RRT,BSRT;Kelly Overton BS, ACSM CEP, Exercise Physiologist    Virtual Visit No    Medication changes reported     No    Fall or balance concerns reported    No    Warm-up and Cool-down Performed on first and last piece of equipment    Resistance Training Performed Yes    VAD Patient? No    PAD/SET Patient? No      Pain Assessment   Currently in Pain? No/denies                Social History   Tobacco Use  Smoking Status Never  Smokeless Tobacco Never    Goals Met:  Independence with exercise equipment Exercise tolerated well No report of concerns or symptoms today Strength training completed today  Goals Unmet:  Not Applicable  Comments: Pt able to follow exercise prescription today without complaint.  Will continue to monitor for progression.    Dr. Firman Hughes is Medical Director for [facility] Cardiac Rehabilitation.  Dr. Fuad Aleskerov is Medical Director for [facility] Pulmonary Rehabilitation.

## 2023-07-23 ENCOUNTER — Encounter

## 2023-07-25 ENCOUNTER — Encounter

## 2023-07-25 ENCOUNTER — Encounter: Admitting: *Deleted

## 2023-07-25 DIAGNOSIS — I5040 Unspecified combined systolic (congestive) and diastolic (congestive) heart failure: Secondary | ICD-10-CM | POA: Diagnosis not present

## 2023-07-25 NOTE — Progress Notes (Signed)
 Daily Session Note  Patient Details  Name: SKYLIN ROOME MRN: [id number] Date of Birth: 07/08/1975 Referring Provider:   Flowsheet Row Cardiac Rehab from 07/12/2023 in [facility] Cardiac and Pulmonary Rehab  Referring Provider Joetta Mustache, MD       Encounter Date: 07/25/2023  Check In:  Session Check In - 07/25/23 1655       Check-In   Supervising physician immediately available to respond to emergencies See telemetry face sheet for immediately available ER MD    Location [facility]-Cardiac & Pulmonary Rehab    Staff Present Sue Em RN,BSN;Joseph Nancey Awkward;Maud Sorenson, RN, BSN, [employer], ACSM CEP, Exercise Physiologist    Virtual Visit No    Medication changes reported     No    Fall or balance concerns reported    No    Warm-up and Cool-down Performed on first and last piece of equipment    Resistance Training Performed Yes    VAD Patient? No    PAD/SET Patient? No      Pain Assessment   Currently in Pain? No/denies                Social History   Tobacco Use  Smoking Status Never  Smokeless Tobacco Never    Goals Met:  Independence with exercise equipment Exercise tolerated well Personal goals reviewed No report of concerns or symptoms today Strength training completed today  Goals Unmet:  Not Applicable  Comments: Pt able to follow exercise prescription today without complaint.  Will continue to monitor for progression.    Dr. Firman Hughes is Medical Director for [facility] Cardiac Rehabilitation.  Dr. Fuad Aleskerov is Medical Director for [facility] Pulmonary Rehabilitation.

## 2023-07-25 NOTE — Progress Notes (Signed)
 Assessment start time: 4:12 PM  Digestive issues/concerns: no known food allergies   24-hours Recall: B: nothing Snack: rasin and peanuts L: nothing D: [place], ww bread and two veggies   Beverages sweet tea, water, juice  Education r/t nutrition plan Patient reports he has not been drinking soda but still drinks sweet tea and juice. Spoke with him about sugary beverages and how it is terrible for weight and blood sugar control. He agrees and says he will work on drinking more water instead. He takes fluid pills and watches how much water he takes in. Asked him what he knew about sodium. He reports he knows that he should stay away from it. Educated on sodium and provided guideline limit of less than 1500mg . He states he wants to lose weight and be heart healthy. Reviewed his recall, and spoke with him about his eating patterns. He currently eats one meal per day and might have a snack as well. Discussed the importance of eating patterns and structured routine. Set goal to start eating in the morning and mid day. Suggested he keep his dinner as is. Given his body's increased needs recommended healthy fats as a way to help increase his caloric intake without making him eat more than he currently can. While he wants to lose weight he can not achieve a healthy weight loss with such low calorie intake from one meal. Reviewed facts label and educated on how to read and identify foods with high calorie density that would also be low in sodium and saturated fats.       Goal 1: Eat 15-30gProtein and 30-60gCarbs at each meal. Goal 2: Include more healthy fats to help increase calorie intake to meet body's needs.  Goal 3: Read labels and reduce sodium intake to below 2300mg . Ideally 1500mg  per day.  End time 4:55 PM

## 2023-08-01 ENCOUNTER — Encounter: Payer: Self-pay | Admitting: *Deleted

## 2023-08-01 ENCOUNTER — Encounter: Attending: Internal Medicine | Admitting: *Deleted

## 2023-08-01 DIAGNOSIS — I5040 Unspecified combined systolic (congestive) and diastolic (congestive) heart failure: Secondary | ICD-10-CM

## 2023-08-01 DIAGNOSIS — Z48812 Encounter for surgical aftercare following surgery on the circulatory system: Secondary | ICD-10-CM | POA: Insufficient documentation

## 2023-08-01 NOTE — Progress Notes (Signed)
 Incomplete Session Note  Patient Details  Name: Connor Blake MRN: [id number] Date of Birth: 1976/03/06 Referring Provider:   Flowsheet Row Cardiac Rehab from 07/12/2023 in [facility] Cardiac and Pulmonary Rehab  Referring Provider Joetta Mustache, MD       [name] did not complete his rehab session.  His resting blood pressure was 200/124, asymptomatic. He forgot his medications at the hotel yesterday and is having them shipped to him by this evening. Safety instructions given and encouraged him to check his blood pressure at home.

## 2023-08-01 NOTE — Progress Notes (Signed)
 Cardiac Individual Treatment Plan  Patient Details  Name: Connor Blake MRN: [id number] Date of Birth: 1975/10/06 Referring Provider:   Flowsheet Row Cardiac Rehab from 07/12/2023 in [facility] Cardiac and Pulmonary Rehab  Referring Provider Joetta Mustache, MD       Initial Encounter Date:  Flowsheet Row Cardiac Rehab from 07/12/2023 in [facility] Cardiac and Pulmonary Rehab  Date 07/12/23       Visit Diagnosis: Combined systolic and diastolic congestive heart failure, unspecified HF chronicity (HCC)  Patient's Home Medications on Admission:  Current Outpatient Medications:    carvedilol (COREG) 3.125 MG tablet, Take 3.125 mg by mouth 2 (two) times daily., Disp: , Rfl:    carvedilol (COREG) 6.25 MG tablet, Take 6.25 mg by mouth 2 (two) times daily. (Patient not taking: Reported on 07/04/2023), Disp: , Rfl:    furosemide (LASIX) 40 MG tablet, Take 1 tablet by mouth 2 (two) times daily., Disp: , Rfl:    meloxicam (MOBIC) 15 MG tablet, Take 15 mg by mouth daily as needed for pain., Disp: , Rfl:    prednisoLONE acetate (PRED FORTE) 1 % ophthalmic suspension, Place 1 drop into the left eye daily. (Patient not taking: Reported on 07/04/2023), Disp: , Rfl:    sacubitril-valsartan (ENTRESTO) 24-26 MG, Take 1 tablet by mouth 2 (two) times daily., Disp: , Rfl:   Past Medical History: Past Medical History:  Diagnosis Date   Medical history non-contributory     Tobacco Use: Social History   Tobacco Use  Smoking Status Never  Smokeless Tobacco Never    Labs: Review Flowsheet        No data to display           Exercise Target Goals: Exercise Program Goal: Individual exercise prescription set using results from initial 6 min walk test and THRR while considering  patient's activity barriers and safety.   Exercise Prescription Goal: Initial exercise prescription builds to 30-45 minutes a day of aerobic activity, 2-3 days per week.  Home exercise guidelines will be given to patient  during program as part of exercise prescription that the participant will acknowledge.   Education: Aerobic Exercise: - Group verbal and visual presentation on the components of exercise prescription. Introduces F.I.T.T principle from ACSM for exercise prescriptions.  Reviews F.I.T.T. principles of aerobic exercise including progression. Written material given at graduation.   Education: Resistance Exercise: - Group verbal and visual presentation on the components of exercise prescription. Introduces F.I.T.T principle from ACSM for exercise prescriptions  Reviews F.I.T.T. principles of resistance exercise including progression. Written material given at graduation.    Education: Exercise & Equipment Safety: - Individual verbal instruction and demonstration of equipment use and safety with use of the equipment. Flowsheet Row Cardiac Rehab from 07/18/2023 in [facility] Cardiac and Pulmonary Rehab  Date 07/12/23  Educator MB  Instruction Review Code 1- Verbalizes Understanding       Education: Exercise Physiology & General Exercise Guidelines: - Group verbal and written instruction with models to review the exercise physiology of the cardiovascular system and associated critical values. Provides general exercise guidelines with specific guidelines to those with heart or lung disease.    Education: Flexibility, Balance, Mind/Body Relaxation: - Group verbal and visual presentation with interactive activity on the components of exercise prescription. Introduces F.I.T.T principle from ACSM for exercise prescriptions. Reviews F.I.T.T. principles of flexibility and balance exercise training including progression. Also discusses the mind body connection.  Reviews various relaxation techniques to help reduce and manage stress (i.e. Deep breathing, progressive  muscle relaxation, and visualization). Balance handout provided to take home. Written material given at graduation.   Activity Barriers & Risk  Stratification:  Activity Barriers & Cardiac Risk Stratification - 07/12/23 1632       Activity Barriers & Cardiac Risk Stratification   Activity Barriers None    Cardiac Risk Stratification High             6 Minute Walk:  6 Minute Walk     Row Name 07/12/23 1631         6 Minute Walk   Phase Initial     Distance 1155 feet     Walk Time 6 minutes     # of Rest Breaks 0     MPH 2.19     METS 2.52     RPE 14     Perceived Dyspnea  2     VO2 Peak 8.82     Symptoms No     Resting HR 83 bpm     Resting BP 148/88     Resting Oxygen Saturation  96 %     Exercise Oxygen Saturation  during 6 min walk 99 %     Max Ex. HR 126 bpm     Max Ex. BP 200/100     2 Minute Post BP 150/90              Oxygen Initial Assessment:   Oxygen Re-Evaluation:   Oxygen Discharge (Final Oxygen Re-Evaluation):   Initial Exercise Prescription:  Initial Exercise Prescription - 07/12/23 1600       Date of Initial Exercise RX and Referring Provider   Date 07/12/23    Referring Provider Joetta Mustache, MD      Oxygen   Maintain Oxygen Saturation 88% or higher      NuStep   Level 2   T6   SPM 80    Minutes 15    METs 2.52      Arm Ergometer   Level 1    RPM 30    Minutes 15    METs 2.52      T5 Nustep   Level 2    SPM 80    Minutes 15    METs 2.52      Biostep-RELP   Level 2    SPM 50    Minutes 15    METs 2.52      Track   Laps 28    Minutes 15    METs 2.52      Prescription Details   Frequency (times per week) 2    Duration Progress to 30 minutes of continuous aerobic without signs/symptoms of physical distress      Intensity   THRR 40-80% of Max Heartrate 119-156    Ratings of Perceived Exertion 11-13    Perceived Dyspnea 0-4      Progression   Progression Continue to progress workloads to maintain intensity without signs/symptoms of physical distress.      Resistance Training   Training Prescription Yes    Weight 5 lb    Reps 10-15              Perform Capillary Blood Glucose checks as needed.  Exercise Prescription Changes:   Exercise Prescription Changes     Row Name 07/12/23 1600 07/26/23 1000           Response to Exercise   Blood Pressure (Admit) 148/88 152/90      Blood Pressure (Exercise) 200/100  190/110      Blood Pressure (Exit) 150/90 148/88      Heart Rate (Admit) 83 bpm 93 bpm      Heart Rate (Exercise) 126 bpm 139 bpm      Heart Rate (Exit) 82 bpm 104 bpm      Oxygen Saturation (Admit) 96 % --      Oxygen Saturation (Exercise) 99 % --      Oxygen Saturation (Exit) 96 % --      Rating of Perceived Exertion (Exercise) 14 14      Perceived Dyspnea (Exercise) 2 --      Symptoms none none      Comments results First week of exercise sessions      Duration Progress to 30 minutes of  aerobic without signs/symptoms of physical distress Continue with 30 min of aerobic exercise without signs/symptoms of physical distress.      Intensity THRR New THRR unchanged        Progression   Progression Continue to progress workloads to maintain intensity without signs/symptoms of physical distress. Continue to progress workloads to maintain intensity without signs/symptoms of physical distress.      Average METs 2.52 2.27        Resistance Training   Training Prescription -- Yes      Weight -- 5 lb      Reps -- 10-15        Interval Training   Interval Training -- No        NuStep   Level -- 2  T6      Minutes -- 15      METs -- 3        Biostep-RELP   Level -- 2      Minutes -- 15      METs -- 2        Track   Laps -- 20      Minutes -- 15      METs -- 2.09        Oxygen   Maintain Oxygen Saturation -- 88% or higher               Exercise Comments:   Exercise Comments     Row Name 07/16/23 1718           Exercise Comments First full day of exercise!  Patient was oriented to gym and equipment including functions, settings, policies, and procedures.  Patient's individual  exercise prescription and treatment plan were reviewed.  All starting workloads were established based on the results of the 6 minute walk test done at initial orientation visit.  The plan for exercise progression was also introduced and progression will be customized based on patient's performance and goals.                Exercise Goals and Review:   Exercise Goals     Row Name 07/12/23 1636             Exercise Goals   Increase Physical Activity Yes       Intervention Provide advice, education, support and counseling about physical activity/exercise needs.;Develop an individualized exercise prescription for aerobic and resistive training based on initial evaluation findings, risk stratification, comorbidities and participant's personal goals.       Expected Outcomes Short Term: Attend rehab on a regular basis to increase amount of physical activity.;Long Term: Add in home exercise to make exercise part of routine and to increase amount of physical activity.;Long  Term: Exercising regularly at least 3-5 days a week.       Increase Strength and Stamina Yes       Intervention Provide advice, education, support and counseling about physical activity/exercise needs.;Develop an individualized exercise prescription for aerobic and resistive training based on initial evaluation findings, risk stratification, comorbidities and participant's personal goals.       Expected Outcomes Short Term: Increase workloads from initial exercise prescription for resistance, speed, and METs.;Short Term: Perform resistance training exercises routinely during rehab and add in resistance training at home;Long Term: Improve cardiorespiratory fitness, muscular endurance and strength as measured by increased METs and functional capacity ( )       Able to understand and use rate of perceived exertion (RPE) scale Yes       Intervention Provide education and explanation on how to use RPE scale       Expected Outcomes  Short Term: Able to use RPE daily in rehab to express subjective intensity level;Long Term:  Able to use RPE to guide intensity level when exercising independently       Able to understand and use Dyspnea scale Yes       Intervention Provide education and explanation on how to use Dyspnea scale       Expected Outcomes Short Term: Able to use Dyspnea scale daily in rehab to express subjective sense of shortness of breath during exertion;Long Term: Able to use Dyspnea scale to guide intensity level when exercising independently       Knowledge and understanding of Target Heart Rate Range (THRR) Yes       Intervention Provide education and explanation of THRR including how the numbers were predicted and where they are located for reference       Expected Outcomes Short Term: Able to state/look up THRR;Short Term: Able to use daily as guideline for intensity in rehab;Long Term: Able to use THRR to govern intensity when exercising independently       Able to check pulse independently Yes       Intervention Provide education and demonstration on how to check pulse in carotid and radial arteries.;Review the importance of being able to check your own pulse for safety during independent exercise       Expected Outcomes Short Term: Able to explain why pulse checking is important during independent exercise;Long Term: Able to check pulse independently and accurately       Understanding of Exercise Prescription Yes       Intervention Provide education, explanation, and written materials on patient's individual exercise prescription       Expected Outcomes Short Term: Able to explain program exercise prescription;Long Term: Able to explain home exercise prescription to exercise independently                Exercise Goals Re-Evaluation :  Exercise Goals Re-Evaluation     Row Name 07/16/23 1720 07/26/23 1049           Exercise Goal Re-Evaluation   Exercise Goals Review Able to understand and use rate  of perceived exertion (RPE) scale;Knowledge and understanding of Target Heart Rate Range (THRR);Increase Physical Activity;Understanding of Exercise Prescription;Increase Strength and Stamina;Able to check pulse independently Increase Physical Activity;Understanding of Exercise Prescription;Increase Strength and Stamina      Comments Reviewed RPE and dyspnea scale, THR and program prescription with pt today.  Pt voiced understanding and was given a copy of goals to take home. Connor Blake is off to a good start in the program. He  tolerated his exercise prescription well. He was able to do level 2 on the biostep and T6 nustep. He also was able to do 20 laps on the track. We will continue to monitor his progress in the program.      Expected Outcomes Short: Use RPE daily to regulate intensity.  Long: Follow program prescription in THR. Short: Continue to follow current exercise prescription. Long: Continue exercise to improve strength and stamina.               Discharge Exercise Prescription (Final Exercise Prescription Changes):  Exercise Prescription Changes - 07/26/23 1000       Response to Exercise   Blood Pressure (Admit) 152/90    Blood Pressure (Exercise) 190/110    Blood Pressure (Exit) 148/88    Heart Rate (Admit) 93 bpm    Heart Rate (Exercise) 139 bpm    Heart Rate (Exit) 104 bpm    Rating of Perceived Exertion (Exercise) 14    Symptoms none    Comments First week of exercise sessions    Duration Continue with 30 min of aerobic exercise without signs/symptoms of physical distress.    Intensity THRR unchanged      Progression   Progression Continue to progress workloads to maintain intensity without signs/symptoms of physical distress.    Average METs 2.27      Resistance Training   Training Prescription Yes    Weight 5 lb    Reps 10-15      Interval Training   Interval Training No      NuStep   Level 2   T6   Minutes 15    METs 3      Biostep-RELP   Level 2     Minutes 15    METs 2      Track   Laps 20    Minutes 15    METs 2.09      Oxygen   Maintain Oxygen Saturation 88% or higher             Nutrition:  Target Goals: Understanding of nutrition guidelines, daily intake of sodium 1500mg , cholesterol 200mg , calories 30% from fat and 7% or less from saturated fats, daily to have 5 or more servings of fruits and vegetables.  Education: All About Nutrition: -Group instruction provided by verbal, written material, interactive activities, discussions, models, and posters to present general guidelines for heart healthy nutrition including fat, fiber, MyPlate, the role of sodium in heart healthy nutrition, utilization of the nutrition label, and utilization of this knowledge for meal planning. Follow up email sent as well. Written material given at graduation.   Biometrics:  Pre Biometrics - 07/12/23 1636       Pre Biometrics   Height 6' 1.62" (1.87 m)    Weight 485 lb 14.4 oz (220.4 kg)    Waist Circumference 63 inches    Hip Circumference 69 inches    Waist to Hip Ratio 0.91 %    BMI (Calculated) 63.03    Single Leg Stand 5.3 seconds              Nutrition Therapy Plan and Nutrition Goals:  Nutrition Therapy & Goals - 07/25/23 1703       Nutrition Therapy   Diet Cardiac, low Na    Protein (specify units) 90    Fiber 30 grams    Whole Grain Foods 3 servings    Saturated Fats 15 max. grams    Fruits and Vegetables 5 servings/day  Sodium 2 grams      Personal Nutrition Goals   Nutrition Goal Eat 15-30gProtein and 30-60gCarbs at each meal.    Personal Goal #2 Include more healthy fats to help increase calorie intake to meet body's needs.    Personal Goal #3 Read labels and reduce sodium intake to below 2300mg . Ideally 1500mg  per day.    Comments Patient reports he has not been drinking soda but still drinks sweet tea and juice. Spoke with him about sugary beverages and how it is terrible for weight and blood sugar  control. He agrees and says he will work on drinking more water instead. He takes fluid pills and watches how much water he takes in. Asked him what he knew about sodium. He reports he knows that he should stay away from it. Educated on sodium and provided guideline limit of less than 1500mg . He states he wants to lose weight and be heart healthy. Reviewed his recall, and spoke with him about his eating patterns. He currently eats one meal per day and might have a snack as well. Discussed the importance of eating patterns and structured routine. Set goal to start eating in the morning and mid day. Suggested he keep his dinner as is. Given his body's increased needs recommended healthy fats as a way to help increase his caloric intake without making him eat more than he currently can. While he wants to lose weight he can not achieve a healthy weight loss with such low calorie intake from one meal. Reviewed facts label and educated on how to read and identify foods with high calorie density that would also be low in sodium and saturated fats.      Intervention Plan   Intervention Prescribe, educate and counsel regarding individualized specific dietary modifications aiming towards targeted core components such as weight, hypertension, lipid management, diabetes, heart failure and other comorbidities.;Nutrition handout(s) given to patient.    Expected Outcomes Short Term Goal: Understand basic principles of dietary content, such as calories, fat, sodium, cholesterol and nutrients.;Short Term Goal: A plan has been developed with personal nutrition goals set during dietitian appointment.;Long Term Goal: Adherence to prescribed nutrition plan.             Nutrition Assessments:  MEDIFICTS Score Key: >=70 Need to make dietary changes  40-70 Heart Healthy Diet <= 40 Therapeutic Level Cholesterol Diet   Picture Your Plate Scores: <75 Unhealthy dietary pattern with much room for improvement. 41-50 Dietary  pattern unlikely to meet recommendations for good health and room for improvement. 51-60 More healthful dietary pattern, with some room for improvement.  >60 Healthy dietary pattern, although there may be some specific behaviors that could be improved.    Nutrition Goals Re-Evaluation:   Nutrition Goals Discharge (Final Nutrition Goals Re-Evaluation):   Psychosocial: Target Goals: Acknowledge presence or absence of significant depression and/or stress, maximize coping skills, provide positive support system. Participant is able to verbalize types and ability to use techniques and skills needed for reducing stress and depression.   Education: Stress, Anxiety, and Depression - Group verbal and visual presentation to define topics covered.  Reviews how body is impacted by stress, anxiety, and depression.  Also discusses healthy ways to reduce stress and to treat/manage anxiety and depression.  Written material given at graduation.   Education: Sleep Hygiene -Provides group verbal and written instruction about how sleep can affect your health.  Define sleep hygiene, discuss sleep cycles and impact of sleep habits. Review good sleep hygiene tips.  Initial Review & Psychosocial Screening:  Initial Psych Review & Screening - 07/04/23 1456       Initial Review   Current issues with None Identified      Family Dynamics   Good Support System? Yes    Comments He can look to his daughter, mother, wife and everyone in his family. Patient does not take anything for his mental state.      Barriers   Psychosocial barriers to participate in program There are no identifiable barriers or psychosocial needs.;The patient should benefit from training in stress management and relaxation.      Screening Interventions   Interventions Provide feedback about the scores to participant;To provide support and resources with identified psychosocial needs;Encouraged to exercise    Expected Outcomes Short  Term goal: Utilizing psychosocial counselor, staff and physician to assist with identification of specific Stressors or current issues interfering with healing process. Setting desired goal for each stressor or current issue identified.;Long Term Goal: Stressors or current issues are controlled or eliminated.;Short Term goal: Identification and review with participant of any Quality of Life or Depression concerns found by scoring the questionnaire.;Long Term goal: The participant improves quality of Life and PHQ9 Scores as seen by post scores and/or verbalization of changes             Quality of Life Scores:   Scores of 19 and below usually indicate a poorer quality of life in these areas.  A difference of  2-3 points is a clinically meaningful difference.  A difference of 2-3 points in the total score of the Quality of Life Index has been associated with significant improvement in overall quality of life, self-image, physical symptoms, and general health in studies assessing change in quality of life.  PHQ-9: Review Flowsheet       07/12/2023  Depression screen PHQ 2/9  Decreased Interest 1  Down, Depressed, Hopeless 0  PHQ - 2 Score 1  Altered sleeping 2  Tired, decreased energy 2  Change in appetite 0  Feeling bad or failure about yourself  1  Trouble concentrating 0  Moving slowly or fidgety/restless 0  Suicidal thoughts 0  PHQ-9 Score 6  Difficult doing work/chores Somewhat difficult   Interpretation of Total Score  Total Score Depression Severity:  1-4 = Minimal depression, 5-9 = Mild depression, 10-14 = Moderate depression, 15-19 = Moderately severe depression, 20-27 = Severe depression   Psychosocial Evaluation and Intervention:  Psychosocial Evaluation - 07/04/23 1457       Psychosocial Evaluation & Interventions   Interventions Relaxation education;Stress management education;Encouraged to exercise with the program and follow exercise prescription    Comments He can  look to his daughter, mother, wife and everyone in his family. Patient does not take anything for his mental state.    Expected Outcomes Short: Start [facility] to help with mood. Long: Maintain a healthy mental state    Continue Psychosocial Services  Follow up required by staff             Psychosocial Re-Evaluation:  Psychosocial Re-Evaluation     Row Name 07/25/23 1732             Psychosocial Re-Evaluation   Current issues with None Identified       Comments Connor Blake has just started cardiac rehab and is getting established in an exercise routine. He states that he sleeps well at night and uses his CPAP. He did not identify any stress or mental health concerns.  Expected Outcomes Short: attend cardiac rehab regulary for the mental health benefits of exercise. Long: maintain good mental health and sleep routine.       Interventions Encouraged to attend Cardiac Rehabilitation for the exercise       Continue Psychosocial Services  Follow up required by staff                Psychosocial Discharge (Final Psychosocial Re-Evaluation):  Psychosocial Re-Evaluation - 07/25/23 1732       Psychosocial Re-Evaluation   Current issues with None Identified    Comments Connor Blake has just started cardiac rehab and is getting established in an exercise routine. He states that he sleeps well at night and uses his CPAP. He did not identify any stress or mental health concerns.    Expected Outcomes Short: attend cardiac rehab regulary for the mental health benefits of exercise. Long: maintain good mental health and sleep routine.    Interventions Encouraged to attend Cardiac Rehabilitation for the exercise    Continue Psychosocial Services  Follow up required by staff             Vocational Rehabilitation: Provide vocational rehab assistance to qualifying candidates.   Vocational Rehab Evaluation & Intervention:   Education: Education Goals: Education classes will be provided  on a variety of topics geared toward better understanding of heart health and risk factor modification. Participant will state understanding/return demonstration of topics presented as noted by education test scores.  Learning Barriers/Preferences:  Learning Barriers/Preferences - 07/04/23 1455       Learning Barriers/Preferences   Learning Barriers Sight    Learning Preferences None             General Cardiac Education Topics:  AED/CPR: - Group verbal and written instruction with the use of models to demonstrate the basic use of the AED with the basic ABC's of resuscitation.   Anatomy and Cardiac Procedures: - Group verbal and visual presentation and models provide information about basic cardiac anatomy and function. Reviews the testing methods done to diagnose heart disease and the outcomes of the test results. Describes the treatment choices: Medical Management, Angioplasty, or Coronary Bypass Surgery for treating various heart conditions including Myocardial Infarction, Angina, Valve Disease, and Cardiac Arrhythmias.  Written material given at graduation.   Medication Safety: - Group verbal and visual instruction to review commonly prescribed medications for heart and lung disease. Reviews the medication, class of the drug, and side effects. Includes the steps to properly store meds and maintain the prescription regimen.  Written material given at graduation.   Intimacy: - Group verbal instruction through game format to discuss how heart and lung disease can affect sexual intimacy. Written material given at graduation..   Know Your Numbers and Heart Failure: - Group verbal and visual instruction to discuss disease risk factors for cardiac and pulmonary disease and treatment options.  Reviews associated critical values for Overweight/Obesity, Hypertension, Cholesterol, and Diabetes.  Discusses basics of heart failure: signs/symptoms and treatments.  Introduces Heart Failure  Zone chart for action plan for heart failure.  Written material given at graduation. Flowsheet Row Cardiac Rehab from 07/18/2023 in [facility] Cardiac and Pulmonary Rehab  Date 07/18/23  Educator SB  Instruction Review Code 1- Verbalizes Understanding       Infection Prevention: - Provides verbal and written material to individual with discussion of infection control including proper hand washing and proper equipment cleaning during exercise session. Flowsheet Row Cardiac Rehab from 07/18/2023 in [facility] Cardiac and Pulmonary Rehab  Date 07/12/23  Educator MB  Instruction Review Code 1- Verbalizes Understanding       Falls Prevention: - Provides verbal and written material to individual with discussion of falls prevention and safety. Flowsheet Row Cardiac Rehab from 07/18/2023 in [facility] Cardiac and Pulmonary Rehab  Date 07/12/23  Educator MB  Instruction Review Code 1- Verbalizes Understanding       Other: -Provides group and verbal instruction on various topics (see comments)   Knowledge Questionnaire Score:   Core Components/Risk Factors/Patient Goals at Admission:  Personal Goals and Risk Factors at Admission - 07/12/23 1637       Core Components/Risk Factors/Patient Goals on Admission    Weight Management Yes;Weight Loss;Obesity    Intervention Weight Management: Develop a combined nutrition and exercise program designed to reach desired caloric intake, while maintaining appropriate intake of nutrient and fiber, sodium and fats, and appropriate energy expenditure required for the weight goal.;Weight Management: Provide education and appropriate resources to help participant work on and attain dietary goals.;Weight Management/Obesity: Establish reasonable short term and long term weight goals.;Obesity: Provide education and appropriate resources to help participant work on and attain dietary goals.    Admit Weight 485 lb 14.4 oz (220.4 kg)    Goal Weight: Short Term 475 lb 14.4 oz  (215.9 kg)    Goal Weight: Long Term 465 lb 14.4 oz (211.3 kg)    Expected Outcomes Short Term: Continue to assess and modify interventions until short term weight is achieved;Weight Loss: Understanding of general recommendations for a balanced deficit meal plan, which promotes 1-2 lb weight loss per week and includes a negative energy balance of [phone] kcal/d;Understanding recommendations for meals to include 15-35% energy as protein, 25-35% energy from fat, 35-60% energy from carbohydrates, less than 200mg  of dietary cholesterol, 20-35 gm of total fiber daily;Understanding of distribution of calorie intake throughout the day with the consumption of 4-5 meals/snacks;Long Term: Adherence to nutrition and physical activity/exercise program aimed toward attainment of established weight goal    Heart Failure Yes    Intervention Provide a combined exercise and nutrition program that is supplemented with education, support and counseling about heart failure. Directed toward relieving symptoms such as shortness of breath, decreased exercise tolerance, and extremity edema.    Expected Outcomes Improve functional capacity of life;Short term: Attendance in program 2-3 days a week with increased exercise capacity. Reported lower sodium intake. Reported increased fruit and vegetable intake. Reports medication compliance.;Short term: Daily weights obtained and reported for increase. Utilizing diuretic protocols set by physician.;Long term: Adoption of self-care skills and reduction of barriers for early signs and symptoms recognition and intervention leading to self-care maintenance.             Education:Diabetes - Individual verbal and written instruction to review signs/symptoms of diabetes, desired ranges of glucose level fasting, after meals and with exercise. Acknowledge that pre and post exercise glucose checks will be done for 3 sessions at entry of program.   Core Components/Risk Factors/Patient  Goals Review:   Goals and Risk Factor Review     Row Name 07/25/23 1729             Core Components/Risk Factors/Patient Goals Review   Personal Goals Review Heart Failure       Review Connor Blake reports that he does have a scale at home and he weighs about 4 times a week. He took home a brochure about heart failure symptoms last week and feels comfortable identifying any heart failure symptoms or concerns  to communicate to his doctor.       Expected Outcomes Short: continue to attend cardiac rehab and continue to monitor weight at home. Long: control cardiac risk factors.                Core Components/Risk Factors/Patient Goals at Discharge (Final Review):   Goals and Risk Factor Review - 07/25/23 1729       Core Components/Risk Factors/Patient Goals Review   Personal Goals Review Heart Failure    Review Connor Blake reports that he does have a scale at home and he weighs about 4 times a week. He took home a brochure about heart failure symptoms last week and feels comfortable identifying any heart failure symptoms or concerns to communicate to his doctor.    Expected Outcomes Short: continue to attend cardiac rehab and continue to monitor weight at home. Long: control cardiac risk factors.             ITP Comments:  ITP Comments     Row Name 07/04/23 1454 07/12/23 1630 07/16/23 1717 08/01/23 4098     ITP Comments Virtual Visit completed. Patient informed on EP and RD appointment and 6 Minute walk test. Patient also informed of patient health questionnaires on My Chart. Patient Verbalizes understanding. Visit diagnosis can be found in [facility] 06/12/2023. Completed and gym orientation for cardiac rehab. Initial ITP created and sent for review to Dr. Firman Hughes, Medical Director. First full day of exercise!  Patient was oriented to gym and equipment including functions, settings, policies, and procedures.  Patient's individual exercise prescription and treatment plan were reviewed.   All starting workloads were established based on the results of the 6 minute walk test done at initial orientation visit.  The plan for exercise progression was also introduced and progression will be customized based on patient's performance and goals. 30 Day review completed. Medical Director ITP review done, changes made as directed, and signed approval by Medical Director.    new to program             Comments:

## 2023-08-06 ENCOUNTER — Encounter: Admitting: *Deleted

## 2023-08-06 DIAGNOSIS — I5040 Unspecified combined systolic (congestive) and diastolic (congestive) heart failure: Secondary | ICD-10-CM

## 2023-08-06 DIAGNOSIS — Z48812 Encounter for surgical aftercare following surgery on the circulatory system: Secondary | ICD-10-CM | POA: Diagnosis not present

## 2023-08-06 NOTE — Progress Notes (Signed)
 Daily Session Note  Patient Details  Name: Connor Blake MRN: [id number] Date of Birth: December 09, 1975 Referring Provider:   Flowsheet Row Cardiac Rehab from 07/12/2023 in [facility] Cardiac and Pulmonary Rehab  Referring Provider Joetta Mustache, MD       Encounter Date: 08/06/2023  Check In:  Session Check In - 08/06/23 1721       Check-In   Supervising physician immediately available to respond to emergencies See telemetry face sheet for immediately available ER MD    Location [facility]-Cardiac & Pulmonary Rehab    Staff Present Sue Em RN,BSN;Joseph [place] Cramp BS, ACSM CEP, Exercise Physiologist    Virtual Visit No    Medication changes reported     No    Fall or balance concerns reported    No    Warm-up and Cool-down Performed on first and last piece of equipment    Resistance Training Performed Yes    VAD Patient? No    PAD/SET Patient? No      Pain Assessment   Currently in Pain? No/denies                Social History   Tobacco Use  Smoking Status Never  Smokeless Tobacco Never    Goals Met:  Independence with exercise equipment Exercise tolerated well Strength training completed today  Goals Unmet:  Not Applicable  Comments: Pt able to follow exercise prescription today without complaint.  Will continue to monitor for progression.    Dr. Firman Hughes is Medical Director for [facility] Cardiac Rehabilitation.  Dr. Fuad Aleskerov is Medical Director for [facility] Pulmonary Rehabilitation.

## 2023-08-08 ENCOUNTER — Encounter

## 2023-08-13 ENCOUNTER — Encounter

## 2023-08-15 ENCOUNTER — Encounter

## 2023-08-22 ENCOUNTER — Encounter

## 2023-08-27 ENCOUNTER — Encounter: Attending: Internal Medicine | Admitting: *Deleted

## 2023-08-27 DIAGNOSIS — I5040 Unspecified combined systolic (congestive) and diastolic (congestive) heart failure: Secondary | ICD-10-CM | POA: Diagnosis present

## 2023-08-27 NOTE — Progress Notes (Signed)
 Daily Session Note  Patient Details  Name: Connor Blake MRN: [id number] Date of Birth: May 05, 1975 Referring Provider:   Flowsheet Row Cardiac Rehab from 07/12/2023 in [facility] Cardiac and Pulmonary Rehab  Referring Provider Joetta Mustache, MD       Encounter Date: 08/27/2023  Check In:  Session Check In - 08/27/23 1730       Check-In   Supervising physician immediately available to respond to emergencies See telemetry face sheet for immediately available ER MD    Location [facility]-Cardiac & Pulmonary Rehab    Staff Present Maud Sorenson, RN, BSN, CCRP;Kelly Hayes BS, ACSM CEP, Exercise Physiologist;Joseph Hood RCP,RRT,BSRT    Virtual Visit No    Medication changes reported     No    Fall or balance concerns reported    No    Warm-up and Cool-down Performed on first and last piece of equipment    Resistance Training Performed Yes    VAD Patient? No    PAD/SET Patient? No      Pain Assessment   Currently in Pain? No/denies             Med changes noted in medication record   Social History   Tobacco Use  Smoking Status Never  Smokeless Tobacco Never    Goals Met:  Independence with exercise equipment Exercise tolerated well Personal goals reviewed No report of concerns or symptoms today  Goals Unmet:  Not Applicable  Comments: Pt able to follow exercise prescription today without complaint.  Will continue to monitor for progression.    Dr. Firman Hughes is Medical Director for [facility] Cardiac Rehabilitation.  Dr. Fuad Aleskerov is Medical Director for [facility] Pulmonary Rehabilitation.

## 2023-08-29 ENCOUNTER — Encounter: Payer: Self-pay | Admitting: *Deleted

## 2023-08-29 ENCOUNTER — Encounter

## 2023-08-29 DIAGNOSIS — I5040 Unspecified combined systolic (congestive) and diastolic (congestive) heart failure: Secondary | ICD-10-CM

## 2023-08-29 NOTE — Progress Notes (Signed)
 Cardiac Individual Treatment Plan  Patient Details  Name: Connor Blake MRN: [id number] Date of Birth: [birth year] Referring Provider:   Flowsheet Row Cardiac Rehab from 07/12/2023 in [facility] Cardiac and Pulmonary Rehab  Referring Provider Joetta Mustache, MD       Initial Encounter Date:  Flowsheet Row Cardiac Rehab from 07/12/2023 in [facility] Cardiac and Pulmonary Rehab  Date 07/12/23       Visit Diagnosis: Combined systolic and diastolic congestive heart failure, unspecified HF chronicity (HCC)  Patient's Home Medications on Admission:  Current Outpatient Medications:    carvedilol (COREG) 3.125 MG tablet, Take 3.125 mg by mouth 2 (two) times daily., Disp: , Rfl:    carvedilol (COREG) 6.25 MG tablet, Take 6.25 mg by mouth 2 (two) times daily. (Patient not taking: Reported on 07/04/2023), Disp: , Rfl:    furosemide (LASIX) 40 MG tablet, Take 1 tablet by mouth 2 (two) times daily., Disp: , Rfl:    losartan (COZAAR) 50 MG tablet, Take 1 tablet by mouth daily., Disp: , Rfl:    meloxicam (MOBIC) 15 MG tablet, Take 15 mg by mouth daily as needed for pain., Disp: , Rfl:    prednisoLONE acetate (PRED FORTE) 1 % ophthalmic suspension, Place 1 drop into the left eye daily. (Patient not taking: Reported on 07/04/2023), Disp: , Rfl:    spironolactone (ALDACTONE) 25 MG tablet, Take 1 tablet by mouth daily., Disp: , Rfl:   Past Medical History: Past Medical History:  Diagnosis Date   Medical history non-contributory     Tobacco Use: Social History   Tobacco Use  Smoking Status Never  Smokeless Tobacco Never    Labs: Review Flowsheet        No data to display           Exercise Target Goals: Exercise Program Goal: Individual exercise prescription set using results from initial 6 min walk test and THRR while considering  patient's activity barriers and safety.   Exercise Prescription Goal: Initial exercise prescription builds to 30-45 minutes a day of aerobic activity, 2-3 days  per week.  Home exercise guidelines will be given to patient during program as part of exercise prescription that the participant will acknowledge.   Education: Aerobic Exercise: - Group verbal and visual presentation on the components of exercise prescription. Introduces F.I.T.T principle from ACSM for exercise prescriptions.  Reviews F.I.T.T. principles of aerobic exercise including progression. Written material given at graduation.   Education: Resistance Exercise: - Group verbal and visual presentation on the components of exercise prescription. Introduces F.I.T.T principle from ACSM for exercise prescriptions  Reviews F.I.T.T. principles of resistance exercise including progression. Written material given at graduation.    Education: Exercise & Equipment Safety: - Individual verbal instruction and demonstration of equipment use and safety with use of the equipment. Flowsheet Row Cardiac Rehab from 07/18/2023 in [facility] Cardiac and Pulmonary Rehab  Date 07/12/23  Educator MB  Instruction Review Code 1- Verbalizes Understanding       Education: Exercise Physiology & General Exercise Guidelines: - Group verbal and written instruction with models to review the exercise physiology of the cardiovascular system and associated critical values. Provides general exercise guidelines with specific guidelines to those with heart or lung disease.    Education: Flexibility, Balance, Mind/Body Relaxation: - Group verbal and visual presentation with interactive activity on the components of exercise prescription. Introduces F.I.T.T principle from ACSM for exercise prescriptions. Reviews F.I.T.T. principles of flexibility and balance exercise training including progression. Also discusses the mind body connection.  Reviews various relaxation techniques to help reduce and manage stress (i.e. Deep breathing, progressive muscle relaxation, and visualization). Balance handout provided to take home. Written  material given at graduation.   Activity Barriers & Risk Stratification:  Activity Barriers & Cardiac Risk Stratification - 07/12/23 1632       Activity Barriers & Cardiac Risk Stratification   Activity Barriers None    Cardiac Risk Stratification High             6 Minute Walk:  6 Minute Walk     Row Name 07/12/23 1631         6 Minute Walk   Phase Initial     Distance 1155 feet     Walk Time 6 minutes     # of Rest Breaks 0     MPH 2.19     METS 2.52     RPE 14     Perceived Dyspnea  2     VO2 Peak 8.82     Symptoms No     Resting HR 83 bpm     Resting BP 148/88     Resting Oxygen Saturation  96 %     Exercise Oxygen Saturation  during 6 min walk 99 %     Max Ex. HR 126 bpm     Max Ex. BP 200/100     2 Minute Post BP 150/90              Oxygen Initial Assessment:   Oxygen Re-Evaluation:   Oxygen Discharge (Final Oxygen Re-Evaluation):   Initial Exercise Prescription:  Initial Exercise Prescription - 07/12/23 1600       Date of Initial Exercise RX and Referring Provider   Date 07/12/23    Referring Provider Joetta Mustache, MD      Oxygen   Maintain Oxygen Saturation 88% or higher      NuStep   Level 2   T6   SPM 80    Minutes 15    METs 2.52      Arm Ergometer   Level 1    RPM 30    Minutes 15    METs 2.52      T5 Nustep   Level 2    SPM 80    Minutes 15    METs 2.52      Biostep-RELP   Level 2    SPM 50    Minutes 15    METs 2.52      Track   Laps 28    Minutes 15    METs 2.52      Prescription Details   Frequency (times per week) 2    Duration Progress to 30 minutes of continuous aerobic without signs/symptoms of physical distress      Intensity   THRR 40-80% of Max Heartrate 119-156    Ratings of Perceived Exertion 11-13    Perceived Dyspnea 0-4      Progression   Progression Continue to progress workloads to maintain intensity without signs/symptoms of physical distress.      Resistance Training    Training Prescription Yes    Weight 5 lb    Reps 10-15             Perform Capillary Blood Glucose checks as needed.  Exercise Prescription Changes:   Exercise Prescription Changes     Row Name 07/12/23 1600 07/26/23 1000 08/09/23 1400 08/23/23 1200       Response to Exercise  Blood Pressure (Admit) 148/88 152/90 124/64 162/100    Blood Pressure (Exercise) 200/100 190/110 -- 180/110    Blood Pressure (Exit) 150/90 148/88 138/80 172/100    Heart Rate (Admit) 83 bpm 93 bpm 107 bpm 96 bpm    Heart Rate (Exercise) 126 bpm 139 bpm 132 bpm 126 bpm    Heart Rate (Exit) 82 bpm 104 bpm 95 bpm 99 bpm    Oxygen Saturation (Admit) 96 % -- -- --    Oxygen Saturation (Exercise) 99 % -- -- --    Oxygen Saturation (Exit) 96 % -- -- --    Rating of Perceived Exertion (Exercise) 14 14 13 11     Perceived Dyspnea (Exercise) 2 -- 2.6 --    Symptoms none none none none    Comments results First week of exercise sessions -- --    Duration Progress to 30 minutes of  aerobic without signs/symptoms of physical distress Continue with 30 min of aerobic exercise without signs/symptoms of physical distress. Continue with 30 min of aerobic exercise without signs/symptoms of physical distress. Continue with 30 min of aerobic exercise without signs/symptoms of physical distress.    Intensity THRR New THRR unchanged THRR unchanged THRR unchanged      Progression   Progression Continue to progress workloads to maintain intensity without signs/symptoms of physical distress. Continue to progress workloads to maintain intensity without signs/symptoms of physical distress. Continue to progress workloads to maintain intensity without signs/symptoms of physical distress. Continue to progress workloads to maintain intensity without signs/symptoms of physical distress.    Average METs 2.52 2.27 2.6 3.5      Resistance Training   Training Prescription -- Yes Yes Yes    Weight -- 5 lb 5 lb 5 lb    Reps -- 10-15  10-15 10-15      Interval Training   Interval Training -- No No No      NuStep   Level -- 2  T6 -- 2  T6    Minutes -- 15 -- 15    METs -- 3 -- 3.5      Biostep-RELP   Level -- 2 2 --    Minutes -- 15 15 --    METs -- 2 3 --      Track   Laps -- 20 23 --    Minutes -- 15 15 --    METs -- 2.09 2.25 --      Oxygen   Maintain Oxygen Saturation -- 88% or higher 88% or higher 88% or higher             Exercise Comments:   Exercise Comments     Row Name 07/16/23 1718           Exercise Comments First full day of exercise!  Patient was oriented to gym and equipment including functions, settings, policies, and procedures.  Patient's individual exercise prescription and treatment plan were reviewed.  All starting workloads were established based on the results of the 6 minute walk test done at initial orientation visit.  The plan for exercise progression was also introduced and progression will be customized based on patient's performance and goals.                Exercise Goals and Review:   Exercise Goals     Row Name 07/12/23 1636             Exercise Goals   Increase Physical Activity Yes  Intervention Provide advice, education, support and counseling about physical activity/exercise needs.;Develop an individualized exercise prescription for aerobic and resistive training based on initial evaluation findings, risk stratification, comorbidities and participant's personal goals.       Expected Outcomes Short Term: Attend rehab on a regular basis to increase amount of physical activity.;Long Term: Add in home exercise to make exercise part of routine and to increase amount of physical activity.;Long Term: Exercising regularly at least 3-5 days a week.       Increase Strength and Stamina Yes       Intervention Provide advice, education, support and counseling about physical activity/exercise needs.;Develop an individualized exercise prescription for aerobic  and resistive training based on initial evaluation findings, risk stratification, comorbidities and participant's personal goals.       Expected Outcomes Short Term: Increase workloads from initial exercise prescription for resistance, speed, and METs.;Short Term: Perform resistance training exercises routinely during rehab and add in resistance training at home;Long Term: Improve cardiorespiratory fitness, muscular endurance and strength as measured by increased METs and functional capacity ( )       Able to understand and use rate of perceived exertion (RPE) scale Yes       Intervention Provide education and explanation on how to use RPE scale       Expected Outcomes Short Term: Able to use RPE daily in rehab to express subjective intensity level;Long Term:  Able to use RPE to guide intensity level when exercising independently       Able to understand and use Dyspnea scale Yes       Intervention Provide education and explanation on how to use Dyspnea scale       Expected Outcomes Short Term: Able to use Dyspnea scale daily in rehab to express subjective sense of shortness of breath during exertion;Long Term: Able to use Dyspnea scale to guide intensity level when exercising independently       Knowledge and understanding of Target Heart Rate Range (THRR) Yes       Intervention Provide education and explanation of THRR including how the numbers were predicted and where they are located for reference       Expected Outcomes Short Term: Able to state/look up THRR;Short Term: Able to use daily as guideline for intensity in rehab;Long Term: Able to use THRR to govern intensity when exercising independently       Able to check pulse independently Yes       Intervention Provide education and demonstration on how to check pulse in carotid and radial arteries.;Review the importance of being able to check your own pulse for safety during independent exercise       Expected Outcomes Short Term: Able to  explain why pulse checking is important during independent exercise;Long Term: Able to check pulse independently and accurately       Understanding of Exercise Prescription Yes       Intervention Provide education, explanation, and written materials on patient's individual exercise prescription       Expected Outcomes Short Term: Able to explain program exercise prescription;Long Term: Able to explain home exercise prescription to exercise independently                Exercise Goals Re-Evaluation :  Exercise Goals Re-Evaluation     Row Name 07/16/23 1720 07/26/23 1049 08/09/23 1500 08/23/23 1248       Exercise Goal Re-Evaluation   Exercise Goals Review Able to understand and use rate of perceived exertion (RPE) scale;Knowledge  and understanding of Target Heart Rate Range (THRR);Increase Physical Activity;Understanding of Exercise Prescription;Increase Strength and Stamina;Able to check pulse independently Increase Physical Activity;Understanding of Exercise Prescription;Increase Strength and Stamina Increase Physical Activity;Understanding of Exercise Prescription;Increase Strength and Stamina Increase Physical Activity;Understanding of Exercise Prescription;Increase Strength and Stamina    Comments Reviewed RPE and dyspnea scale, THR and program prescription with pt today.  Pt voiced understanding and was given a copy of goals to take home. Connor Blake is off to a good start in the program. He tolerated his exercise prescription well. He was able to do level 2 on the biostep and T6 nustep. He also was able to do 20 laps on the track. We will continue to monitor his progress in the program. Connor Blake is doing well in rehab. He was only able to attend one session during this weeks review period. During the session he was able to increase from 20 to 23 laps on the track in 15 minutes. We will continue to monitor his progress in the program. Connor Blake is doing well in the program. He was only able to attend one  session during this review period. During his session he was able to use the T6 nustep at level 2. We will continue to monitor his progress in the program.    Expected Outcomes Short: Use RPE daily to regulate intensity.  Long: Follow program prescription in THR. Short: Continue to follow current exercise prescription. Long: Continue exercise to improve strength and stamina. Short: Continue to follow current exercise prescription. Long: Continue exercise to improve strength and stamina. Short: Attend rehab more consistently. Long: Continue exercise to improve strength and stamina.             Discharge Exercise Prescription (Final Exercise Prescription Changes):  Exercise Prescription Changes - 08/23/23 1200       Response to Exercise   Blood Pressure (Admit) 162/100    Blood Pressure (Exercise) 180/110    Blood Pressure (Exit) 172/100    Heart Rate (Admit) 96 bpm    Heart Rate (Exercise) 126 bpm    Heart Rate (Exit) 99 bpm    Rating of Perceived Exertion (Exercise) 11    Symptoms none    Duration Continue with 30 min of aerobic exercise without signs/symptoms of physical distress.    Intensity THRR unchanged      Progression   Progression Continue to progress workloads to maintain intensity without signs/symptoms of physical distress.    Average METs 3.5      Resistance Training   Training Prescription Yes    Weight 5 lb    Reps 10-15      Interval Training   Interval Training No      NuStep   Level 2   T6   Minutes 15    METs 3.5      Oxygen   Maintain Oxygen Saturation 88% or higher             Nutrition:  Target Goals: Understanding of nutrition guidelines, daily intake of sodium 1500mg , cholesterol 200mg , calories 30% from fat and 7% or less from saturated fats, daily to have 5 or more servings of fruits and vegetables.  Education: All About Nutrition: -Group instruction provided by verbal, written material, interactive activities, discussions, models,  and posters to present general guidelines for heart healthy nutrition including fat, fiber, MyPlate, the role of sodium in heart healthy nutrition, utilization of the nutrition label, and utilization of this knowledge for meal planning. Follow up email sent as  well. Written material given at graduation.   Biometrics:  Pre Biometrics - 07/12/23 1636       Pre Biometrics   Height 6' 1.62" (1.87 m)    Weight 485 lb 14.4 oz (220.4 kg)    Waist Circumference 63 inches    Hip Circumference 69 inches    Waist to Hip Ratio 0.91 %    BMI (Calculated) 63.03    Single Leg Stand 5.3 seconds              Nutrition Therapy Plan and Nutrition Goals:  Nutrition Therapy & Goals - 07/25/23 1703       Nutrition Therapy   Diet Cardiac, low Na    Protein (specify units) 90    Fiber 30 grams    Whole Grain Foods 3 servings    Saturated Fats 15 max. grams    Fruits and Vegetables 5 servings/day    Sodium 2 grams      Personal Nutrition Goals   Nutrition Goal Eat 15-30gProtein and 30-60gCarbs at each meal.    Personal Goal #2 Include more healthy fats to help increase calorie intake to meet body's needs.    Personal Goal #3 Read labels and reduce sodium intake to below 2300mg . Ideally 1500mg  per day.    Comments Patient reports he has not been drinking soda but still drinks sweet tea and juice. Spoke with him about sugary beverages and how it is terrible for weight and blood sugar control. He agrees and says he will work on drinking more water instead. He takes fluid pills and watches how much water he takes in. Asked him what he knew about sodium. He reports he knows that he should stay away from it. Educated on sodium and provided guideline limit of less than 1500mg . He states he wants to lose weight and be heart healthy. Reviewed his recall, and spoke with him about his eating patterns. He currently eats one meal per day and might have a snack as well. Discussed the importance of eating patterns  and structured routine. Set goal to start eating in the morning and mid day. Suggested he keep his dinner as is. Given his body's increased needs recommended healthy fats as a way to help increase his caloric intake without making him eat more than he currently can. While he wants to lose weight he can not achieve a healthy weight loss with such low calorie intake from one meal. Reviewed facts label and educated on how to read and identify foods with high calorie density that would also be low in sodium and saturated fats.      Intervention Plan   Intervention Prescribe, educate and counsel regarding individualized specific dietary modifications aiming towards targeted core components such as weight, hypertension, lipid management, diabetes, heart failure and other comorbidities.;Nutrition handout(s) given to patient.    Expected Outcomes Short Term Goal: Understand basic principles of dietary content, such as calories, fat, sodium, cholesterol and nutrients.;Short Term Goal: A plan has been developed with personal nutrition goals set during dietitian appointment.;Long Term Goal: Adherence to prescribed nutrition plan.             Nutrition Assessments:  MEDIFICTS Score Key: >=70 Need to make dietary changes  40-70 Heart Healthy Diet <= 40 Therapeutic Level Cholesterol Diet   Picture Your Plate Scores: <13 Unhealthy dietary pattern with much room for improvement. 41-50 Dietary pattern unlikely to meet recommendations for good health and room for improvement. 51-60 More healthful dietary pattern, with some  room for improvement.  >60 Healthy dietary pattern, although there may be some specific behaviors that could be improved.    Nutrition Goals Re-Evaluation:  Nutrition Goals Re-Evaluation     Row Name 08/27/23 1759             Goals   Comment Connor Blake states that before he was eating one meal a day. He has started adding breakfast (yogurt and granola) so now he is up to eating 2  meals a day. He was encouraged add small healthy snacks thoughout the day. He has been reading food labels and chosing lower sodium choices. He also now only occationally drinks sweet tea, not every day.       Expected Outcome Short: add healthy snacks thoughout the day. Long: maintain heart healthy diet.                Nutrition Goals Discharge (Final Nutrition Goals Re-Evaluation):  Nutrition Goals Re-Evaluation - 08/27/23 1759       Goals   Comment Connor Blake states that before he was eating one meal a day. He has started adding breakfast (yogurt and granola) so now he is up to eating 2 meals a day. He was encouraged add small healthy snacks thoughout the day. He has been reading food labels and chosing lower sodium choices. He also now only occationally drinks sweet tea, not every day.    Expected Outcome Short: add healthy snacks thoughout the day. Long: maintain heart healthy diet.             Psychosocial: Target Goals: Acknowledge presence or absence of significant depression and/or stress, maximize coping skills, provide positive support system. Participant is able to verbalize types and ability to use techniques and skills needed for reducing stress and depression.   Education: Stress, Anxiety, and Depression - Group verbal and visual presentation to define topics covered.  Reviews how body is impacted by stress, anxiety, and depression.  Also discusses healthy ways to reduce stress and to treat/manage anxiety and depression.  Written material given at graduation.   Education: Sleep Hygiene -Provides group verbal and written instruction about how sleep can affect your health.  Define sleep hygiene, discuss sleep cycles and impact of sleep habits. Review good sleep hygiene tips.    Initial Review & Psychosocial Screening:  Initial Psych Review & Screening - 07/04/23 1456       Initial Review   Current issues with None Identified      Family Dynamics   Good Support  System? Yes    Comments He can look to his daughter, mother, wife and everyone in his family. Patient does not take anything for his mental state.      Barriers   Psychosocial barriers to participate in program There are no identifiable barriers or psychosocial needs.;The patient should benefit from training in stress management and relaxation.      Screening Interventions   Interventions Provide feedback about the scores to participant;To provide support and resources with identified psychosocial needs;Encouraged to exercise    Expected Outcomes Short Term goal: Utilizing psychosocial counselor, staff and physician to assist with identification of specific Stressors or current issues interfering with healing process. Setting desired goal for each stressor or current issue identified.;Long Term Goal: Stressors or current issues are controlled or eliminated.;Short Term goal: Identification and review with participant of any Quality of Life or Depression concerns found by scoring the questionnaire.;Long Term goal: The participant improves quality of Life and PHQ9 Scores as seen by  post scores and/or verbalization of changes             Quality of Life Scores:   Scores of 19 and below usually indicate a poorer quality of life in these areas.  A difference of  2-3 points is a clinically meaningful difference.  A difference of 2-3 points in the total score of the Quality of Life Index has been associated with significant improvement in overall quality of life, self-image, physical symptoms, and general health in studies assessing change in quality of life.  PHQ-9: Review Flowsheet       08/27/2023 07/12/2023  Depression screen PHQ 2/9  Decreased Interest 1 1  Down, Depressed, Hopeless 0 0  PHQ - 2 Score 1 1  Altered sleeping 1 2  Tired, decreased energy 2 2  Change in appetite 0 0  Feeling bad or failure about yourself  1 1  Trouble concentrating 0 0  Moving slowly or fidgety/restless 0 0   Suicidal thoughts 0 0  PHQ-9 Score 5 6  Difficult doing work/chores Not difficult at all Somewhat difficult   Interpretation of Total Score  Total Score Depression Severity:  1-4 = Minimal depression, 5-9 = Mild depression, 10-14 = Moderate depression, 15-19 = Moderately severe depression, 20-27 = Severe depression   Psychosocial Evaluation and Intervention:  Psychosocial Evaluation - 07/04/23 1457       Psychosocial Evaluation & Interventions   Interventions Relaxation education;Stress management education;Encouraged to exercise with the program and follow exercise prescription    Comments He can look to his daughter, mother, wife and everyone in his family. Patient does not take anything for his mental state.    Expected Outcomes Short: Start HeartTrack to help with mood. Long: Maintain a healthy mental state    Continue Psychosocial Services  Follow up required by staff             Psychosocial Re-Evaluation:  Psychosocial Re-Evaluation     Row Name 07/25/23 1732 08/27/23 1757           Psychosocial Re-Evaluation   Current issues with None Identified None Identified      Comments Connor Blake has just started cardiac rehab and is getting established in an exercise routine. He states that he sleeps well at night and uses his CPAP. He did not identify any stress or mental health concerns. Connor Blake states that he has no new stess, sleep, or mental health concerns. He has a positive attitude. He has been out of class for a few weeks working on med changes to get his BP down. He was back in class today and tolerated exercise well.      Expected Outcomes Short: attend cardiac rehab regulary for the mental health benefits of exercise. Long: maintain good mental health and sleep routine. Short: attend cardiac rehab consistently to recieve mental health benefits from exercise. Long: maintain good mental health routine.      Interventions Encouraged to attend Cardiac Rehabilitation for the  exercise Encouraged to attend Cardiac Rehabilitation for the exercise      Continue Psychosocial Services  Follow up required by staff Follow up required by staff               Psychosocial Discharge (Final Psychosocial Re-Evaluation):  Psychosocial Re-Evaluation - 08/27/23 1757       Psychosocial Re-Evaluation   Current issues with None Identified    Comments Connor Blake states that he has no new stess, sleep, or mental health concerns. He has a positive attitude.  He has been out of class for a few weeks working on med changes to get his BP down. He was back in class today and tolerated exercise well.    Expected Outcomes Short: attend cardiac rehab consistently to recieve mental health benefits from exercise. Long: maintain good mental health routine.    Interventions Encouraged to attend Cardiac Rehabilitation for the exercise    Continue Psychosocial Services  Follow up required by staff             Vocational Rehabilitation: Provide vocational rehab assistance to qualifying candidates.   Vocational Rehab Evaluation & Intervention:   Education: Education Goals: Education classes will be provided on a variety of topics geared toward better understanding of heart health and risk factor modification. Participant will state understanding/return demonstration of topics presented as noted by education test scores.  Learning Barriers/Preferences:  Learning Barriers/Preferences - 07/04/23 1455       Learning Barriers/Preferences   Learning Barriers Sight    Learning Preferences None             General Cardiac Education Topics:  AED/CPR: - Group verbal and written instruction with the use of models to demonstrate the basic use of the AED with the basic ABC's of resuscitation.   Anatomy and Cardiac Procedures: - Group verbal and visual presentation and models provide information about basic cardiac anatomy and function. Reviews the testing methods done to diagnose heart  disease and the outcomes of the test results. Describes the treatment choices: Medical Management, Angioplasty, or Coronary Bypass Surgery for treating various heart conditions including Myocardial Infarction, Angina, Valve Disease, and Cardiac Arrhythmias.  Written material given at graduation.   Medication Safety: - Group verbal and visual instruction to review commonly prescribed medications for heart and lung disease. Reviews the medication, class of the drug, and side effects. Includes the steps to properly store meds and maintain the prescription regimen.  Written material given at graduation.   Intimacy: - Group verbal instruction through game format to discuss how heart and lung disease can affect sexual intimacy. Written material given at graduation..   Know Your Numbers and Heart Failure: - Group verbal and visual instruction to discuss disease risk factors for cardiac and pulmonary disease and treatment options.  Reviews associated critical values for Overweight/Obesity, Hypertension, Cholesterol, and Diabetes.  Discusses basics of heart failure: signs/symptoms and treatments.  Introduces Heart Failure Zone chart for action plan for heart failure.  Written material given at graduation. Flowsheet Row Cardiac Rehab from 07/18/2023 in [facility] Cardiac and Pulmonary Rehab  Date 07/18/23  Educator SB  Instruction Review Code 1- Verbalizes Understanding       Infection Prevention: - Provides verbal and written material to individual with discussion of infection control including proper hand washing and proper equipment cleaning during exercise session. Flowsheet Row Cardiac Rehab from 07/18/2023 in [facility] Cardiac and Pulmonary Rehab  Date 07/12/23  Educator MB  Instruction Review Code 1- Verbalizes Understanding       Falls Prevention: - Provides verbal and written material to individual with discussion of falls prevention and safety. Flowsheet Row Cardiac Rehab from 07/18/2023 in [facility]  Cardiac and Pulmonary Rehab  Date 07/12/23  Educator MB  Instruction Review Code 1- Verbalizes Understanding       Other: -Provides group and verbal instruction on various topics (see comments)   Knowledge Questionnaire Score:   Core Components/Risk Factors/Patient Goals at Admission:  Personal Goals and Risk Factors at Admission - 07/12/23 1637  Core Components/Risk Factors/Patient Goals on Admission    Weight Management Yes;Weight Loss;Obesity    Intervention Weight Management: Develop a combined nutrition and exercise program designed to reach desired caloric intake, while maintaining appropriate intake of nutrient and fiber, sodium and fats, and appropriate energy expenditure required for the weight goal.;Weight Management: Provide education and appropriate resources to help participant work on and attain dietary goals.;Weight Management/Obesity: Establish reasonable short term and long term weight goals.;Obesity: Provide education and appropriate resources to help participant work on and attain dietary goals.    Admit Weight 485 lb 14.4 oz (220.4 kg)    Goal Weight: Short Term 475 lb 14.4 oz (215.9 kg)    Goal Weight: Long Term 465 lb 14.4 oz (211.3 kg)    Expected Outcomes Short Term: Continue to assess and modify interventions until short term weight is achieved;Weight Loss: Understanding of general recommendations for a balanced deficit meal plan, which promotes 1-2 lb weight loss per week and includes a negative energy balance of [id number] kcal/d;Understanding recommendations for meals to include 15-35% energy as protein, 25-35% energy from fat, 35-60% energy from carbohydrates, less than 200mg  of dietary cholesterol, 20-35 gm of total fiber daily;Understanding of distribution of calorie intake throughout the day with the consumption of 4-5 meals/snacks;Long Term: Adherence to nutrition and physical activity/exercise program aimed toward attainment of established weight goal     Heart Failure Yes    Intervention Provide a combined exercise and nutrition program that is supplemented with education, support and counseling about heart failure. Directed toward relieving symptoms such as shortness of breath, decreased exercise tolerance, and extremity edema.    Expected Outcomes Improve functional capacity of life;Short term: Attendance in program 2-3 days a week with increased exercise capacity. Reported lower sodium intake. Reported increased fruit and vegetable intake. Reports medication compliance.;Short term: Daily weights obtained and reported for increase. Utilizing diuretic protocols set by physician.;Long term: Adoption of self-care skills and reduction of barriers for early signs and symptoms recognition and intervention leading to self-care maintenance.             Education:Diabetes - Individual verbal and written instruction to review signs/symptoms of diabetes, desired ranges of glucose level fasting, after meals and with exercise. Acknowledge that pre and post exercise glucose checks will be done for 3 sessions at entry of program.   Core Components/Risk Factors/Patient Goals Review:   Goals and Risk Factor Review     Row Name 07/25/23 1729 08/27/23 1801           Core Components/Risk Factors/Patient Goals Review   Personal Goals Review Heart Failure Heart Failure      Review Connor Blake reports that he does have a scale at home and he weighs about 4 times a week. He took home a brochure about heart failure symptoms last week and feels comfortable identifying any heart failure symptoms or concerns to communicate to his doctor. Connor Blake reports he continues to  weight several times a week and has the handout for heart failure signs and symptoms to look out for. He know to call his doctor if there are changes in weight or has concerns with any heart failure symptoms.      Expected Outcomes Short: continue to attend cardiac rehab and continue to monitor weight at  home. Long: control cardiac risk factors. Short: continue to monitor weight at home. Long: control cardiac risk factors.               Core Components/Risk Factors/Patient Goals at  Discharge (Final Review):   Goals and Risk Factor Review - 08/27/23 1801       Core Components/Risk Factors/Patient Goals Review   Personal Goals Review Heart Failure    Review Connor Blake reports he continues to  weight several times a week and has the handout for heart failure signs and symptoms to look out for. He know to call his doctor if there are changes in weight or has concerns with any heart failure symptoms.    Expected Outcomes Short: continue to monitor weight at home. Long: control cardiac risk factors.             ITP Comments:  ITP Comments     Row Name 07/04/23 1454 07/12/23 1630 07/16/23 1717 08/01/23 0822 08/29/23 0911   ITP Comments Virtual Visit completed. Patient informed on EP and RD appointment and 6 Minute walk test. Patient also informed of patient health questionnaires on My Chart. Patient Verbalizes understanding. Visit diagnosis can be found in [facility] 06/12/2023. Completed and gym orientation for cardiac rehab. Initial ITP created and sent for review to Dr. Firman Hughes, Medical Director. First full day of exercise!  Patient was oriented to gym and equipment including functions, settings, policies, and procedures.  Patient's individual exercise prescription and treatment plan were reviewed.  All starting workloads were established based on the results of the 6 minute walk test done at initial orientation visit.  The plan for exercise progression was also introduced and progression will be customized based on patient's performance and goals. 30 Day review completed. Medical Director ITP review done, changes made as directed, and signed approval by Medical Director.    new to program 30 Day review completed. Medical Director ITP review done, changes made as directed, and signed approval by  Medical Director.            Comments:

## 2023-09-03 ENCOUNTER — Encounter

## 2023-09-05 ENCOUNTER — Encounter

## 2023-09-10 ENCOUNTER — Encounter

## 2023-09-12 ENCOUNTER — Encounter

## 2023-09-17 ENCOUNTER — Encounter

## 2023-09-19 ENCOUNTER — Encounter

## 2023-09-24 ENCOUNTER — Encounter

## 2023-09-26 ENCOUNTER — Telehealth: Payer: Self-pay | Admitting: *Deleted

## 2023-09-26 ENCOUNTER — Encounter

## 2023-09-26 DIAGNOSIS — I5040 Unspecified combined systolic (congestive) and diastolic (congestive) heart failure: Secondary | ICD-10-CM

## 2023-09-26 NOTE — Progress Notes (Signed)
 30 Day review completed. Medical Director ITP review done, changes made as directed, and signed approval by Medical Director. ? ?

## 2023-09-26 NOTE — Progress Notes (Signed)
 Cardiac Individual Treatment Plan  Patient Details  Name: COPELAN MAULTSBY MRN: [id number] Date of Birth: Nov 04, 1975 Referring Provider:   Flowsheet Row Cardiac Rehab from 07/12/2023 in [facility] Cardiac and Pulmonary Rehab  Referring Provider Wilburn Fillers, MD    Initial Encounter Date:  Flowsheet Row Cardiac Rehab from 07/12/2023 in [facility] Cardiac and Pulmonary Rehab  Date 07/12/23    Visit Diagnosis: Combined systolic and diastolic congestive heart failure, unspecified HF chronicity (HCC)  Patient's Home Medications on Admission:  Current Outpatient Medications:    carvedilol (COREG) 3.125 MG tablet, Take 3.125 mg by mouth 2 (two) times daily., Disp: , Rfl:    carvedilol (COREG) 6.25 MG tablet, Take 6.25 mg by mouth 2 (two) times daily. (Patient not taking: Reported on 07/04/2023), Disp: , Rfl:    furosemide (LASIX) 40 MG tablet, Take 1 tablet by mouth 2 (two) times daily., Disp: , Rfl:    losartan (COZAAR) 50 MG tablet, Take 1 tablet by mouth daily., Disp: , Rfl:    meloxicam (MOBIC) 15 MG tablet, Take 15 mg by mouth daily as needed for pain., Disp: , Rfl:    prednisoLONE acetate (PRED FORTE) 1 % ophthalmic suspension, Place 1 drop into the left eye daily. (Patient not taking: Reported on 07/04/2023), Disp: , Rfl:    spironolactone (ALDACTONE) 25 MG tablet, Take 1 tablet by mouth daily., Disp: , Rfl:   Past Medical History: Past Medical History:  Diagnosis Date   Medical history non-contributory     Tobacco Use: Social History   Tobacco Use  Smoking Status Never  Smokeless Tobacco Never    Labs: Review Flowsheet        No data to display           Exercise Target Goals: Exercise Program Goal: Individual exercise prescription set using results from initial 6 min walk test and THRR while considering  patient's activity barriers and safety.   Exercise Prescription Goal: Initial exercise prescription builds to 30-45 minutes a day of aerobic activity, 2-3 days per week.   Home exercise guidelines will be given to patient during program as part of exercise prescription that the participant will acknowledge.   Education: Aerobic Exercise: - Group verbal and visual presentation on the components of exercise prescription. Introduces F.I.T.T principle from ACSM for exercise prescriptions.  Reviews F.I.T.T. principles of aerobic exercise including progression. Written material given at graduation.   Education: Resistance Exercise: - Group verbal and visual presentation on the components of exercise prescription. Introduces F.I.T.T principle from ACSM for exercise prescriptions  Reviews F.I.T.T. principles of resistance exercise including progression. Written material given at graduation.    Education: Exercise & Equipment Safety: - Individual verbal instruction and demonstration of equipment use and safety with use of the equipment. Flowsheet Row Cardiac Rehab from 07/18/2023 in [facility] Cardiac and Pulmonary Rehab  Date 07/12/23  Educator MB  Instruction Review Code 1- Verbalizes Understanding    Education: Exercise Physiology & General Exercise Guidelines: - Group verbal and written instruction with models to review the exercise physiology of the cardiovascular system and associated critical values. Provides general exercise guidelines with specific guidelines to those with heart or lung disease.    Education: Flexibility, Balance, Mind/Body Relaxation: - Group verbal and visual presentation with interactive activity on the components of exercise prescription. Introduces F.I.T.T principle from ACSM for exercise prescriptions. Reviews F.I.T.T. principles of flexibility and balance exercise training including progression. Also discusses the mind body connection.  Reviews various relaxation techniques to help reduce and  manage stress (i.e. Deep breathing, progressive muscle relaxation, and visualization). Balance handout provided to take home. Written material given at  graduation.   Activity Barriers & Risk Stratification:  Activity Barriers & Cardiac Risk Stratification - 07/12/23 1632       Activity Barriers & Cardiac Risk Stratification   Activity Barriers None    Cardiac Risk Stratification High          6 Minute Walk:  6 Minute Walk     Row Name 07/12/23 1631         6 Minute Walk   Phase Initial     Distance 1155 feet     Walk Time 6 minutes     # of Rest Breaks 0     MPH 2.19     METS 2.52     RPE 14     Perceived Dyspnea  2     VO2 Peak 8.82     Symptoms No     Resting HR 83 bpm     Resting BP 148/88     Resting Oxygen Saturation  96 %     Exercise Oxygen Saturation  during 6 min walk 99 %     Max Ex. HR 126 bpm     Max Ex. BP 200/100     2 Minute Post BP 150/90        Oxygen Initial Assessment:   Oxygen Re-Evaluation:   Oxygen Discharge (Final Oxygen Re-Evaluation):   Initial Exercise Prescription:  Initial Exercise Prescription - 07/12/23 1600       Date of Initial Exercise RX and Referring Provider   Date 07/12/23    Referring Provider Wilburn Fillers, MD      Oxygen   Maintain Oxygen Saturation 88% or higher      NuStep   Level 2   T6   SPM 80    Minutes 15    METs 2.52      Arm Ergometer   Level 1    RPM 30    Minutes 15    METs 2.52      T5 Nustep   Level 2    SPM 80    Minutes 15    METs 2.52      Biostep-RELP   Level 2    SPM 50    Minutes 15    METs 2.52      Track   Laps 28    Minutes 15    METs 2.52      Prescription Details   Frequency (times per week) 2    Duration Progress to 30 minutes of continuous aerobic without signs/symptoms of physical distress      Intensity   THRR 40-80% of Max Heartrate 119-156    Ratings of Perceived Exertion 11-13    Perceived Dyspnea 0-4      Progression   Progression Continue to progress workloads to maintain intensity without signs/symptoms of physical distress.      Resistance Training   Training Prescription Yes    Weight  5 lb    Reps 10-15          Perform Capillary Blood Glucose checks as needed.  Exercise Prescription Changes:   Exercise Prescription Changes     Row Name 07/12/23 1600 07/26/23 1000 08/09/23 1400 08/23/23 1200 09/06/23 0800     Response to Exercise   Blood Pressure (Admit) 148/88 152/90 124/64 162/100 160/100   Blood Pressure (Exercise) 200/100 190/110 -- 180/110 210/108  Blood Pressure (Exit) 150/90 148/88 138/80 172/100 162/102   Heart Rate (Admit) 83 bpm 93 bpm 107 bpm 96 bpm 80 bpm   Heart Rate (Exercise) 126 bpm 139 bpm 132 bpm 126 bpm 132 bpm   Heart Rate (Exit) 82 bpm 104 bpm 95 bpm 99 bpm 78 bpm   Oxygen Saturation (Admit) 96 % -- -- -- --   Oxygen Saturation (Exercise) 99 % -- -- -- --   Oxygen Saturation (Exit) 96 % -- -- -- --   Rating of Perceived Exertion (Exercise) 14 14 13 11 13    Perceived Dyspnea (Exercise) 2 -- 2.6 -- --   Symptoms none none none none none   Comments results First week of exercise sessions -- -- --   Duration Progress to 30 minutes of  aerobic without signs/symptoms of physical distress Continue with 30 min of aerobic exercise without signs/symptoms of physical distress. Continue with 30 min of aerobic exercise without signs/symptoms of physical distress. Continue with 30 min of aerobic exercise without signs/symptoms of physical distress. Continue with 30 min of aerobic exercise without signs/symptoms of physical distress.   Intensity THRR New THRR unchanged THRR unchanged THRR unchanged THRR unchanged     Progression   Progression Continue to progress workloads to maintain intensity without signs/symptoms of physical distress. Continue to progress workloads to maintain intensity without signs/symptoms of physical distress. Continue to progress workloads to maintain intensity without signs/symptoms of physical distress. Continue to progress workloads to maintain intensity without signs/symptoms of physical distress. Continue to progress  workloads to maintain intensity without signs/symptoms of physical distress.   Average METs 2.52 2.27 2.6 3.5 2.72     Resistance Training   Training Prescription -- Yes Yes Yes Yes   Weight -- 5 lb 5 lb 5 lb 5 lb   Reps -- 10-15 10-15 10-15 10-15     Interval Training   Interval Training -- No No No No     NuStep   Level -- 2  T6 -- 2  T6 2  T6   Minutes -- 15 -- 15 15   METs -- 3 -- 3.5 2.6     Biostep-RELP   Level -- 2 2 -- 2   Minutes -- 15 15 -- 15   METs -- 2 3 -- 3     Track   Laps -- 20 23 -- 23   Minutes -- 15 15 -- 15   METs -- 2.09 2.25 -- 2.25     Oxygen   Maintain Oxygen Saturation -- 88% or higher 88% or higher 88% or higher 88% or higher      Exercise Comments:   Exercise Comments     Row Name 07/16/23 1718           Exercise Comments First full day of exercise!  Patient was oriented to gym and equipment including functions, settings, policies, and procedures.  Patient's individual exercise prescription and treatment plan were reviewed.  All starting workloads were established based on the results of the 6 minute walk test done at initial orientation visit.  The plan for exercise progression was also introduced and progression will be customized based on patient's performance and goals.          Exercise Goals and Review:   Exercise Goals     Row Name 07/12/23 1636             Exercise Goals   Increase Physical Activity Yes  Intervention Provide advice, education, support and counseling about physical activity/exercise needs.;Develop an individualized exercise prescription for aerobic and resistive training based on initial evaluation findings, risk stratification, comorbidities and participant's personal goals.       Expected Outcomes Short Term: Attend rehab on a regular basis to increase amount of physical activity.;Long Term: Add in home exercise to make exercise part of routine and to increase amount of physical activity.;Long Term:  Exercising regularly at least 3-5 days a week.       Increase Strength and Stamina Yes       Intervention Provide advice, education, support and counseling about physical activity/exercise needs.;Develop an individualized exercise prescription for aerobic and resistive training based on initial evaluation findings, risk stratification, comorbidities and participant's personal goals.       Expected Outcomes Short Term: Increase workloads from initial exercise prescription for resistance, speed, and METs.;Short Term: Perform resistance training exercises routinely during rehab and add in resistance training at home;Long Term: Improve cardiorespiratory fitness, muscular endurance and strength as measured by increased METs and functional capacity ( )       Able to understand and use rate of perceived exertion (RPE) scale Yes       Intervention Provide education and explanation on how to use RPE scale       Expected Outcomes Short Term: Able to use RPE daily in rehab to express subjective intensity level;Long Term:  Able to use RPE to guide intensity level when exercising independently       Able to understand and use Dyspnea scale Yes       Intervention Provide education and explanation on how to use Dyspnea scale       Expected Outcomes Short Term: Able to use Dyspnea scale daily in rehab to express subjective sense of shortness of breath during exertion;Long Term: Able to use Dyspnea scale to guide intensity level when exercising independently       Knowledge and understanding of Target Heart Rate Range (THRR) Yes       Intervention Provide education and explanation of THRR including how the numbers were predicted and where they are located for reference       Expected Outcomes Short Term: Able to state/look up THRR;Short Term: Able to use daily as guideline for intensity in rehab;Long Term: Able to use THRR to govern intensity when exercising independently       Able to check pulse independently Yes        Intervention Provide education and demonstration on how to check pulse in carotid and radial arteries.;Review the importance of being able to check your own pulse for safety during independent exercise       Expected Outcomes Short Term: Able to explain why pulse checking is important during independent exercise;Long Term: Able to check pulse independently and accurately       Understanding of Exercise Prescription Yes       Intervention Provide education, explanation, and written materials on patient's individual exercise prescription       Expected Outcomes Short Term: Able to explain program exercise prescription;Long Term: Able to explain home exercise prescription to exercise independently          Exercise Goals Re-Evaluation :  Exercise Goals Re-Evaluation     Row Name 07/16/23 1720 07/26/23 1049 08/09/23 1500 08/23/23 1248 09/06/23 0810     Exercise Goal Re-Evaluation   Exercise Goals Review Able to understand and use rate of perceived exertion (RPE) scale;Knowledge and understanding of Target Heart Rate  Range (THRR);Increase Physical Activity;Understanding of Exercise Prescription;Increase Strength and Stamina;Able to check pulse independently Increase Physical Activity;Understanding of Exercise Prescription;Increase Strength and Stamina Increase Physical Activity;Understanding of Exercise Prescription;Increase Strength and Stamina Increase Physical Activity;Understanding of Exercise Prescription;Increase Strength and Stamina Increase Physical Activity;Understanding of Exercise Prescription;Increase Strength and Stamina   Comments Reviewed RPE and dyspnea scale, THR and program prescription with pt today.  Pt voiced understanding and was given a copy of goals to take home. Tavarius is off to a good start in the program. He tolerated his exercise prescription well. He was able to do level 2 on the biostep and T6 nustep. He also was able to do 20 laps on the track. We will continue to  monitor his progress in the program. Haleem is doing well in rehab. He was only able to attend one session during this weeks review period. During the session he was able to increase from 20 to 23 laps on the track in 15 minutes. We will continue to monitor his progress in the program. Galvin is doing well in the program. He was only able to attend one session during this review period. During his session he was able to use the T6 nustep at level 2. We will continue to monitor his progress in the program. Weber is doing well in the program. He has continued to walk up to 23 laps on the track. He also was able to continue to work at level 2 on the T6 nustep and biostep. We will continue to monitor his progress in the program.   Expected Outcomes Short: Use RPE daily to regulate intensity.  Long: Follow program prescription in THR. Short: Continue to follow current exercise prescription. Long: Continue exercise to improve strength and stamina. Short: Continue to follow current exercise prescription. Long: Continue exercise to improve strength and stamina. Short: Attend rehab more consistently. Long: Continue exercise to improve strength and stamina. Short: Begin to push for more laps on the track. Long: Continue exercise to improve strength and stamina.    Row Name 09/19/23 1004             Exercise Goal Re-Evaluation   Exercise Goals Review Increase Physical Activity;Understanding of Exercise Prescription;Increase Strength and Stamina       Comments Jerod was not able to attend the rehab during this review period. He last attended on 6/02. We will continue to stay in touch with him to determine his status in the program.       Expected Outcomes Short: Return to rehab. Long: Continue exercise to improve strength and stamina.          Discharge Exercise Prescription (Final Exercise Prescription Changes):  Exercise Prescription Changes - 09/06/23 0800       Response to Exercise   Blood Pressure  (Admit) 160/100    Blood Pressure (Exercise) 210/108    Blood Pressure (Exit) 162/102    Heart Rate (Admit) 80 bpm    Heart Rate (Exercise) 132 bpm    Heart Rate (Exit) 78 bpm    Rating of Perceived Exertion (Exercise) 13    Symptoms none    Duration Continue with 30 min of aerobic exercise without signs/symptoms of physical distress.    Intensity THRR unchanged      Progression   Progression Continue to progress workloads to maintain intensity without signs/symptoms of physical distress.    Average METs 2.72      Resistance Training   Training Prescription Yes    Weight 5 lb  Reps 10-15      Interval Training   Interval Training No      NuStep   Level 2   T6   Minutes 15    METs 2.6      Biostep-RELP   Level 2    Minutes 15    METs 3      Track   Laps 23    Minutes 15    METs 2.25      Oxygen   Maintain Oxygen Saturation 88% or higher          Nutrition:  Target Goals: Understanding of nutrition guidelines, daily intake of sodium 1500mg , cholesterol 200mg , calories 30% from fat and 7% or less from saturated fats, daily to have 5 or more servings of fruits and vegetables.  Education: All About Nutrition: -Group instruction provided by verbal, written material, interactive activities, discussions, models, and posters to present general guidelines for heart healthy nutrition including fat, fiber, MyPlate, the role of sodium in heart healthy nutrition, utilization of the nutrition label, and utilization of this knowledge for meal planning. Follow up email sent as well. Written material given at graduation.   Biometrics:  Pre Biometrics - 07/12/23 1636       Pre Biometrics   Height 6' 1.62 (1.87 m)    Weight 485 lb 14.4 oz (220.4 kg)    Waist Circumference 63 inches    Hip Circumference 69 inches    Waist to Hip Ratio 0.91 %    BMI (Calculated) 63.03    Single Leg Stand 5.3 seconds           Nutrition Therapy Plan and Nutrition Goals:  Nutrition  Therapy & Goals - 07/25/23 1703       Nutrition Therapy   Diet Cardiac, low Na    Protein (specify units) 90    Fiber 30 grams    Whole Grain Foods 3 servings    Saturated Fats 15 max. grams    Fruits and Vegetables 5 servings/day    Sodium 2 grams      Personal Nutrition Goals   Nutrition Goal Eat 15-30gProtein and 30-60gCarbs at each meal.    Personal Goal #2 Include more healthy fats to help increase calorie intake to meet body's needs.    Personal Goal #3 Read labels and reduce sodium intake to below 2300mg . Ideally 1500mg  per day.    Comments Patient reports he has not been drinking soda but still drinks sweet tea and juice. Spoke with him about sugary beverages and how it is terrible for weight and blood sugar control. He agrees and says he will work on drinking more water instead. He takes fluid pills and watches how much water he takes in. Asked him what he knew about sodium. He reports he knows that he should stay away from it. Educated on sodium and provided guideline limit of less than 1500mg . He states he wants to lose weight and be heart healthy. Reviewed his recall, and spoke with him about his eating patterns. He currently eats one meal per day and might have a snack as well. Discussed the importance of eating patterns and structured routine. Set goal to start eating in the morning and mid day. Suggested he keep his dinner as is. Given his body's increased needs recommended healthy fats as a way to help increase his caloric intake without making him eat more than he currently can. While he wants to lose weight he can not achieve a healthy weight loss  with such low calorie intake from one meal. Reviewed facts label and educated on how to read and identify foods with high calorie density that would also be low in sodium and saturated fats.      Intervention Plan   Intervention Prescribe, educate and counsel regarding individualized specific dietary modifications aiming towards  targeted core components such as weight, hypertension, lipid management, diabetes, heart failure and other comorbidities.;Nutrition handout(s) given to patient.    Expected Outcomes Short Term Goal: Understand basic principles of dietary content, such as calories, fat, sodium, cholesterol and nutrients.;Short Term Goal: A plan has been developed with personal nutrition goals set during dietitian appointment.;Long Term Goal: Adherence to prescribed nutrition plan.          Nutrition Assessments:  MEDIFICTS Score Key: >=70 Need to make dietary changes  40-70 Heart Healthy Diet <= 40 Therapeutic Level Cholesterol Diet   Picture Your Plate Scores: <59 Unhealthy dietary pattern with much room for improvement. 41-50 Dietary pattern unlikely to meet recommendations for good health and room for improvement. 51-60 More healthful dietary pattern, with some room for improvement.  >60 Healthy dietary pattern, although there may be some specific behaviors that could be improved.    Nutrition Goals Re-Evaluation:  Nutrition Goals Re-Evaluation     Row Name 08/27/23 1759             Goals   Comment Keidan states that before he was eating one meal a day. He has started adding breakfast (yogurt and granola) so now he is up to eating 2 meals a day. He was encouraged add small healthy snacks thoughout the day. He has been reading food labels and chosing lower sodium choices. He also now only occationally drinks sweet tea, not every day.       Expected Outcome Short: add healthy snacks thoughout the day. Long: maintain heart healthy diet.          Nutrition Goals Discharge (Final Nutrition Goals Re-Evaluation):  Nutrition Goals Re-Evaluation - 08/27/23 1759       Goals   Comment Zekiel states that before he was eating one meal a day. He has started adding breakfast (yogurt and granola) so now he is up to eating 2 meals a day. He was encouraged add small healthy snacks thoughout the day. He has  been reading food labels and chosing lower sodium choices. He also now only occationally drinks sweet tea, not every day.    Expected Outcome Short: add healthy snacks thoughout the day. Long: maintain heart healthy diet.          Psychosocial: Target Goals: Acknowledge presence or absence of significant depression and/or stress, maximize coping skills, provide positive support system. Participant is able to verbalize types and ability to use techniques and skills needed for reducing stress and depression.   Education: Stress, Anxiety, and Depression - Group verbal and visual presentation to define topics covered.  Reviews how body is impacted by stress, anxiety, and depression.  Also discusses healthy ways to reduce stress and to treat/manage anxiety and depression.  Written material given at graduation.   Education: Sleep Hygiene -Provides group verbal and written instruction about how sleep can affect your health.  Define sleep hygiene, discuss sleep cycles and impact of sleep habits. Review good sleep hygiene tips.    Initial Review & Psychosocial Screening:  Initial Psych Review & Screening - 07/04/23 1456       Initial Review   Current issues with None Identified  Family Dynamics   Good Support System? Yes    Comments He can look to his daughter, mother, wife and everyone in his family. Patient does not take anything for his mental state.      Barriers   Psychosocial barriers to participate in program There are no identifiable barriers or psychosocial needs.;The patient should benefit from training in stress management and relaxation.      Screening Interventions   Interventions Provide feedback about the scores to participant;To provide support and resources with identified psychosocial needs;Encouraged to exercise    Expected Outcomes Short Term goal: Utilizing psychosocial counselor, staff and physician to assist with identification of specific Stressors or current  issues interfering with healing process. Setting desired goal for each stressor or current issue identified.;Long Term Goal: Stressors or current issues are controlled or eliminated.;Short Term goal: Identification and review with participant of any Quality of Life or Depression concerns found by scoring the questionnaire.;Long Term goal: The participant improves quality of Life and PHQ9 Scores as seen by post scores and/or verbalization of changes          Quality of Life Scores:   Scores of 19 and below usually indicate a poorer quality of life in these areas.  A difference of  2-3 points is a clinically meaningful difference.  A difference of 2-3 points in the total score of the Quality of Life Index has been associated with significant improvement in overall quality of life, self-image, physical symptoms, and general health in studies assessing change in quality of life.  PHQ-9: Review Flowsheet       08/27/2023 07/12/2023  Depression screen PHQ 2/9  Decreased Interest 1 1  Down, Depressed, Hopeless 0 0  PHQ - 2 Score 1 1  Altered sleeping 1 2  Tired, decreased energy 2 2  Change in appetite 0 0  Feeling bad or failure about yourself  1 1  Trouble concentrating 0 0  Moving slowly or fidgety/restless 0 0  Suicidal thoughts 0 0  PHQ-9 Score 5 6  Difficult doing work/chores Not difficult at all Somewhat difficult   Interpretation of Total Score  Total Score Depression Severity:  1-4 = Minimal depression, 5-9 = Mild depression, 10-14 = Moderate depression, 15-19 = Moderately severe depression, 20-27 = Severe depression   Psychosocial Evaluation and Intervention:  Psychosocial Evaluation - 07/04/23 1457       Psychosocial Evaluation & Interventions   Interventions Relaxation education;Stress management education;Encouraged to exercise with the program and follow exercise prescription    Comments He can look to his daughter, mother, wife and everyone in his family. Patient does not  take anything for his mental state.    Expected Outcomes Short: Start [facility] to help with mood. Long: Maintain a healthy mental state    Continue Psychosocial Services  Follow up required by staff          Psychosocial Re-Evaluation:  Psychosocial Re-Evaluation     Row Name 07/25/23 1732 08/27/23 1757           Psychosocial Re-Evaluation   Current issues with None Identified None Identified      Comments Evyn has just started cardiac rehab and is getting established in an exercise routine. He states that he sleeps well at night and uses his CPAP. He did not identify any stress or mental health concerns. Lamonta states that he has no new stess, sleep, or mental health concerns. He has a positive attitude. He has been out of class for a  few weeks working on med changes to get his BP down. He was back in class today and tolerated exercise well.      Expected Outcomes Short: attend cardiac rehab regulary for the mental health benefits of exercise. Long: maintain good mental health and sleep routine. Short: attend cardiac rehab consistently to recieve mental health benefits from exercise. Long: maintain good mental health routine.      Interventions Encouraged to attend Cardiac Rehabilitation for the exercise Encouraged to attend Cardiac Rehabilitation for the exercise      Continue Psychosocial Services  Follow up required by staff Follow up required by staff         Psychosocial Discharge (Final Psychosocial Re-Evaluation):  Psychosocial Re-Evaluation - 08/27/23 1757       Psychosocial Re-Evaluation   Current issues with None Identified    Comments Quinnten states that he has no new stess, sleep, or mental health concerns. He has a positive attitude. He has been out of class for a few weeks working on med changes to get his BP down. He was back in class today and tolerated exercise well.    Expected Outcomes Short: attend cardiac rehab consistently to recieve mental health benefits  from exercise. Long: maintain good mental health routine.    Interventions Encouraged to attend Cardiac Rehabilitation for the exercise    Continue Psychosocial Services  Follow up required by staff          Vocational Rehabilitation: Provide vocational rehab assistance to qualifying candidates.   Vocational Rehab Evaluation & Intervention:   Education: Education Goals: Education classes will be provided on a variety of topics geared toward better understanding of heart health and risk factor modification. Participant will state understanding/return demonstration of topics presented as noted by education test scores.  Learning Barriers/Preferences:  Learning Barriers/Preferences - 07/04/23 1455       Learning Barriers/Preferences   Learning Barriers Sight    Learning Preferences None          General Cardiac Education Topics:  AED/CPR: - Group verbal and written instruction with the use of models to demonstrate the basic use of the AED with the basic ABC's of resuscitation.   Anatomy and Cardiac Procedures: - Group verbal and visual presentation and models provide information about basic cardiac anatomy and function. Reviews the testing methods done to diagnose heart disease and the outcomes of the test results. Describes the treatment choices: Medical Management, Angioplasty, or Coronary Bypass Surgery for treating various heart conditions including Myocardial Infarction, Angina, Valve Disease, and Cardiac Arrhythmias.  Written material given at graduation.   Medication Safety: - Group verbal and visual instruction to review commonly prescribed medications for heart and lung disease. Reviews the medication, class of the drug, and side effects. Includes the steps to properly store meds and maintain the prescription regimen.  Written material given at graduation.   Intimacy: - Group verbal instruction through game format to discuss how heart and lung disease can affect  sexual intimacy. Written material given at graduation..   Know Your Numbers and Heart Failure: - Group verbal and visual instruction to discuss disease risk factors for cardiac and pulmonary disease and treatment options.  Reviews associated critical values for Overweight/Obesity, Hypertension, Cholesterol, and Diabetes.  Discusses basics of heart failure: signs/symptoms and treatments.  Introduces Heart Failure Zone chart for action plan for heart failure.  Written material given at graduation. Flowsheet Row Cardiac Rehab from 07/18/2023 in [facility] Cardiac and Pulmonary Rehab  Date 07/18/23  Educator SB  Instruction Review Code 1- Verbalizes Understanding    Infection Prevention: - Provides verbal and written material to individual with discussion of infection control including proper hand washing and proper equipment cleaning during exercise session. Flowsheet Row Cardiac Rehab from 07/18/2023 in [facility] Cardiac and Pulmonary Rehab  Date 07/12/23  Educator MB  Instruction Review Code 1- Verbalizes Understanding    Falls Prevention: - Provides verbal and written material to individual with discussion of falls prevention and safety. Flowsheet Row Cardiac Rehab from 07/18/2023 in [facility] Cardiac and Pulmonary Rehab  Date 07/12/23  Educator MB  Instruction Review Code 1- Verbalizes Understanding    Other: -Provides group and verbal instruction on various topics (see comments)   Knowledge Questionnaire Score:   Core Components/Risk Factors/Patient Goals at Admission:  Personal Goals and Risk Factors at Admission - 07/12/23 1637       Core Components/Risk Factors/Patient Goals on Admission    Weight Management Yes;Weight Loss;Obesity    Intervention Weight Management: Develop a combined nutrition and exercise program designed to reach desired caloric intake, while maintaining appropriate intake of nutrient and fiber, sodium and fats, and appropriate energy expenditure required for the weight  goal.;Weight Management: Provide education and appropriate resources to help participant work on and attain dietary goals.;Weight Management/Obesity: Establish reasonable short term and long term weight goals.;Obesity: Provide education and appropriate resources to help participant work on and attain dietary goals.    Admit Weight 485 lb 14.4 oz (220.4 kg)    Goal Weight: Short Term 475 lb 14.4 oz (215.9 kg)    Goal Weight: Long Term 465 lb 14.4 oz (211.3 kg)    Expected Outcomes Short Term: Continue to assess and modify interventions until short term weight is achieved;Weight Loss: Understanding of general recommendations for a balanced deficit meal plan, which promotes 1-2 lb weight loss per week and includes a negative energy balance of [phone] kcal/d;Understanding recommendations for meals to include 15-35% energy as protein, 25-35% energy from fat, 35-60% energy from carbohydrates, less than 200mg  of dietary cholesterol, 20-35 gm of total fiber daily;Understanding of distribution of calorie intake throughout the day with the consumption of 4-5 meals/snacks;Long Term: Adherence to nutrition and physical activity/exercise program aimed toward attainment of established weight goal    Heart Failure Yes    Intervention Provide a combined exercise and nutrition program that is supplemented with education, support and counseling about heart failure. Directed toward relieving symptoms such as shortness of breath, decreased exercise tolerance, and extremity edema.    Expected Outcomes Improve functional capacity of life;Short term: Attendance in program 2-3 days a week with increased exercise capacity. Reported lower sodium intake. Reported increased fruit and vegetable intake. Reports medication compliance.;Short term: Daily weights obtained and reported for increase. Utilizing diuretic protocols set by physician.;Long term: Adoption of self-care skills and reduction of barriers for early signs and symptoms  recognition and intervention leading to self-care maintenance.          Education:Diabetes - Individual verbal and written instruction to review signs/symptoms of diabetes, desired ranges of glucose level fasting, after meals and with exercise. Acknowledge that pre and post exercise glucose checks will be done for 3 sessions at entry of program.   Core Components/Risk Factors/Patient Goals Review:   Goals and Risk Factor Review     Row Name 07/25/23 1729 08/27/23 1801           Core Components/Risk Factors/Patient Goals Review   Personal Goals Review Heart Failure Heart Failure  Review Climmie reports that he does have a scale at home and he weighs about 4 times a week. He took home a brochure about heart failure symptoms last week and feels comfortable identifying any heart failure symptoms or concerns to communicate to his doctor. Marus reports he continues to  weight several times a week and has the handout for heart failure signs and symptoms to look out for. He know to call his doctor if there are changes in weight or has concerns with any heart failure symptoms.      Expected Outcomes Short: continue to attend cardiac rehab and continue to monitor weight at home. Long: control cardiac risk factors. Short: continue to monitor weight at home. Long: control cardiac risk factors.         Core Components/Risk Factors/Patient Goals at Discharge (Final Review):   Goals and Risk Factor Review - 08/27/23 1801       Core Components/Risk Factors/Patient Goals Review   Personal Goals Review Heart Failure    Review Marus reports he continues to  weight several times a week and has the handout for heart failure signs and symptoms to look out for. He know to call his doctor if there are changes in weight or has concerns with any heart failure symptoms.    Expected Outcomes Short: continue to monitor weight at home. Long: control cardiac risk factors.          ITP Comments:  ITP  Comments     Row Name 07/04/23 1454 07/12/23 1630 07/16/23 1717 08/01/23 0822 08/29/23 0911   ITP Comments Virtual Visit completed. Patient informed on EP and RD appointment and 6 Minute walk test. Patient also informed of patient health questionnaires on My Chart. Patient Verbalizes understanding. Visit diagnosis can be found in [facility] 06/12/2023. Completed and gym orientation for cardiac rehab. Initial ITP created and sent for review to Dr. Oneil Pinal, Medical Director. First full day of exercise!  Patient was oriented to gym and equipment including functions, settings, policies, and procedures.  Patient's individual exercise prescription and treatment plan were reviewed.  All starting workloads were established based on the results of the 6 minute walk test done at initial orientation visit.  The plan for exercise progression was also introduced and progression will be customized based on patient's performance and goals. 30 Day review completed. Medical Director ITP review done, changes made as directed, and signed approval by Medical Director.    new to program 30 Day review completed. Medical Director ITP review done, changes made as directed, and signed approval by Medical Director.    Row Name 09/26/23 0747           ITP Comments 30 Day review completed. Medical Director ITP review done, changes made as directed, and signed approval by Medical Director.          Comments: 30 day review

## 2023-09-26 NOTE — Telephone Encounter (Signed)
 Left message regarding patient's attendance. He has not attended since 6/2. Stated that if staff does not hear from him by next week, he will be discharged from the program.

## 2023-10-01 ENCOUNTER — Encounter

## 2023-10-03 ENCOUNTER — Encounter

## 2023-10-03 ENCOUNTER — Encounter: Payer: Self-pay | Admitting: *Deleted

## 2023-10-03 DIAGNOSIS — I5040 Unspecified combined systolic (congestive) and diastolic (congestive) heart failure: Secondary | ICD-10-CM

## 2023-10-03 NOTE — Progress Notes (Signed)
 Early Discharge Summary   [name] DOB: 02-Mar-1976  Mr. Maino is being discharged at this time due to lack of attendance. Staff has tried to contact him with no success. He completd 8 of 36 sessions.     6 Minute Walk     Row Name 07/12/23 1631         6 Minute Walk   Phase Initial     Distance 1155 feet     Walk Time 6 minutes     # of Rest Breaks 0     MPH 2.19     METS 2.52     RPE 14     Perceived Dyspnea  2     VO2 Peak 8.82     Symptoms No     Resting HR 83 bpm     Resting BP 148/88     Resting Oxygen Saturation  96 %     Exercise Oxygen Saturation  during 6 min walk 99 %     Max Ex. HR 126 bpm     Max Ex. BP 200/100     2 Minute Post BP 150/90

## 2023-10-03 NOTE — Progress Notes (Signed)
 Cardiac Individual Treatment Plan  Patient Details  Name: Connor Blake MRN: [id number] Date of Birth: 1975-11-09 Referring Provider:   Flowsheet Row Cardiac Rehab from 07/12/2023 in [facility] Cardiac and Pulmonary Rehab  Referring Provider Wilburn Fillers, MD    Initial Encounter Date:  Flowsheet Row Cardiac Rehab from 07/12/2023 in [facility] Cardiac and Pulmonary Rehab  Date 07/12/23    Visit Diagnosis: Combined systolic and diastolic congestive heart failure, unspecified HF chronicity (HCC)  Patient's Home Medications on Admission:  Current Outpatient Medications:    carvedilol (COREG) 3.125 MG tablet, Take 3.125 mg by mouth 2 (two) times daily., Disp: , Rfl:    carvedilol (COREG) 6.25 MG tablet, Take 6.25 mg by mouth 2 (two) times daily. (Patient not taking: Reported on 07/04/2023), Disp: , Rfl:    furosemide (LASIX) 40 MG tablet, Take 1 tablet by mouth 2 (two) times daily., Disp: , Rfl:    losartan (COZAAR) 50 MG tablet, Take 1 tablet by mouth daily., Disp: , Rfl:    meloxicam (MOBIC) 15 MG tablet, Take 15 mg by mouth daily as needed for pain., Disp: , Rfl:    prednisoLONE acetate (PRED FORTE) 1 % ophthalmic suspension, Place 1 drop into the left eye daily. (Patient not taking: Reported on 07/04/2023), Disp: , Rfl:    spironolactone (ALDACTONE) 25 MG tablet, Take 1 tablet by mouth daily., Disp: , Rfl:   Past Medical History: Past Medical History:  Diagnosis Date   Medical history non-contributory     Tobacco Use: Social History   Tobacco Use  Smoking Status Never  Smokeless Tobacco Never    Labs: Review Flowsheet        No data to display           Exercise Target Goals: Exercise Program Goal: Individual exercise prescription set using results from initial 6 min walk test and THRR while considering  patient's activity barriers and safety.   Exercise Prescription Goal: Initial exercise prescription builds to 30-45 minutes a day of aerobic activity, 2-3 days per week.   Home exercise guidelines will be given to patient during program as part of exercise prescription that the participant will acknowledge.   Education: Aerobic Exercise: - Group verbal and visual presentation on the components of exercise prescription. Introduces F.I.T.T principle from ACSM for exercise prescriptions.  Reviews F.I.T.T. principles of aerobic exercise including progression. Written material given at graduation.   Education: Resistance Exercise: - Group verbal and visual presentation on the components of exercise prescription. Introduces F.I.T.T principle from ACSM for exercise prescriptions  Reviews F.I.T.T. principles of resistance exercise including progression. Written material given at graduation.    Education: Exercise & Equipment Safety: - Individual verbal instruction and demonstration of equipment use and safety with use of the equipment. Flowsheet Row Cardiac Rehab from 07/18/2023 in [facility] Cardiac and Pulmonary Rehab  Date 07/12/23  Educator MB  Instruction Review Code 1- Verbalizes Understanding    Education: Exercise Physiology & General Exercise Guidelines: - Group verbal and written instruction with models to review the exercise physiology of the cardiovascular system and associated critical values. Provides general exercise guidelines with specific guidelines to those with heart or lung disease.    Education: Flexibility, Balance, Mind/Body Relaxation: - Group verbal and visual presentation with interactive activity on the components of exercise prescription. Introduces F.I.T.T principle from ACSM for exercise prescriptions. Reviews F.I.T.T. principles of flexibility and balance exercise training including progression. Also discusses the mind body connection.  Reviews various relaxation techniques to help reduce and  manage stress (i.e. Deep breathing, progressive muscle relaxation, and visualization). Balance handout provided to take home. Written material given at  graduation.   Activity Barriers & Risk Stratification:  Activity Barriers & Cardiac Risk Stratification - 07/12/23 1632       Activity Barriers & Cardiac Risk Stratification   Activity Barriers None    Cardiac Risk Stratification High          6 Minute Walk:  6 Minute Walk     Row Name 07/12/23 1631         6 Minute Walk   Phase Initial     Distance 1155 feet     Walk Time 6 minutes     # of Rest Breaks 0     MPH 2.19     METS 2.52     RPE 14     Perceived Dyspnea  2     VO2 Peak 8.82     Symptoms No     Resting HR 83 bpm     Resting BP 148/88     Resting Oxygen Saturation  96 %     Exercise Oxygen Saturation  during 6 min walk 99 %     Max Ex. HR 126 bpm     Max Ex. BP 200/100     2 Minute Post BP 150/90        Oxygen Initial Assessment:   Oxygen Re-Evaluation:   Oxygen Discharge (Final Oxygen Re-Evaluation):   Initial Exercise Prescription:  Initial Exercise Prescription - 07/12/23 1600       Date of Initial Exercise RX and Referring Provider   Date 07/12/23    Referring Provider Wilburn Fillers, MD      Oxygen   Maintain Oxygen Saturation 88% or higher      NuStep   Level 2   T6   SPM 80    Minutes 15    METs 2.52      Arm Ergometer   Level 1    RPM 30    Minutes 15    METs 2.52      T5 Nustep   Level 2    SPM 80    Minutes 15    METs 2.52      Biostep-RELP   Level 2    SPM 50    Minutes 15    METs 2.52      Track   Laps 28    Minutes 15    METs 2.52      Prescription Details   Frequency (times per week) 2    Duration Progress to 30 minutes of continuous aerobic without signs/symptoms of physical distress      Intensity   THRR 40-80% of Max Heartrate 119-156    Ratings of Perceived Exertion 11-13    Perceived Dyspnea 0-4      Progression   Progression Continue to progress workloads to maintain intensity without signs/symptoms of physical distress.      Resistance Training   Training Prescription Yes    Weight  5 lb    Reps 10-15          Perform Capillary Blood Glucose checks as needed.  Exercise Prescription Changes:   Exercise Prescription Changes     Row Name 07/12/23 1600 07/26/23 1000 08/09/23 1400 08/23/23 1200 09/06/23 0800     Response to Exercise   Blood Pressure (Admit) 148/88 152/90 124/64 162/100 160/100   Blood Pressure (Exercise) 200/100 190/110 -- 180/110 210/108  Blood Pressure (Exit) 150/90 148/88 138/80 172/100 162/102   Heart Rate (Admit) 83 bpm 93 bpm 107 bpm 96 bpm 80 bpm   Heart Rate (Exercise) 126 bpm 139 bpm 132 bpm 126 bpm 132 bpm   Heart Rate (Exit) 82 bpm 104 bpm 95 bpm 99 bpm 78 bpm   Oxygen Saturation (Admit) 96 % -- -- -- --   Oxygen Saturation (Exercise) 99 % -- -- -- --   Oxygen Saturation (Exit) 96 % -- -- -- --   Rating of Perceived Exertion (Exercise) 14 14 13 11 13    Perceived Dyspnea (Exercise) 2 -- 2.6 -- --   Symptoms none none none none none   Comments results First week of exercise sessions -- -- --   Duration Progress to 30 minutes of  aerobic without signs/symptoms of physical distress Continue with 30 min of aerobic exercise without signs/symptoms of physical distress. Continue with 30 min of aerobic exercise without signs/symptoms of physical distress. Continue with 30 min of aerobic exercise without signs/symptoms of physical distress. Continue with 30 min of aerobic exercise without signs/symptoms of physical distress.   Intensity THRR New THRR unchanged THRR unchanged THRR unchanged THRR unchanged     Progression   Progression Continue to progress workloads to maintain intensity without signs/symptoms of physical distress. Continue to progress workloads to maintain intensity without signs/symptoms of physical distress. Continue to progress workloads to maintain intensity without signs/symptoms of physical distress. Continue to progress workloads to maintain intensity without signs/symptoms of physical distress. Continue to progress  workloads to maintain intensity without signs/symptoms of physical distress.   Average METs 2.52 2.27 2.6 3.5 2.72     Resistance Training   Training Prescription -- Yes Yes Yes Yes   Weight -- 5 lb 5 lb 5 lb 5 lb   Reps -- 10-15 10-15 10-15 10-15     Interval Training   Interval Training -- No No No No     NuStep   Level -- 2  T6 -- 2  T6 2  T6   Minutes -- 15 -- 15 15   METs -- 3 -- 3.5 2.6     Biostep-RELP   Level -- 2 2 -- 2   Minutes -- 15 15 -- 15   METs -- 2 3 -- 3     Track   Laps -- 20 23 -- 23   Minutes -- 15 15 -- 15   METs -- 2.09 2.25 -- 2.25     Oxygen   Maintain Oxygen Saturation -- 88% or higher 88% or higher 88% or higher 88% or higher      Exercise Comments:   Exercise Comments     Row Name 07/16/23 1718           Exercise Comments First full day of exercise!  Patient was oriented to gym and equipment including functions, settings, policies, and procedures.  Patient's individual exercise prescription and treatment plan were reviewed.  All starting workloads were established based on the results of the 6 minute walk test done at initial orientation visit.  The plan for exercise progression was also introduced and progression will be customized based on patient's performance and goals.          Exercise Goals and Review:   Exercise Goals     Row Name 07/12/23 1636             Exercise Goals   Increase Physical Activity Yes  Intervention Provide advice, education, support and counseling about physical activity/exercise needs.;Develop an individualized exercise prescription for aerobic and resistive training based on initial evaluation findings, risk stratification, comorbidities and participant's personal goals.       Expected Outcomes Short Term: Attend rehab on a regular basis to increase amount of physical activity.;Long Term: Add in home exercise to make exercise part of routine and to increase amount of physical activity.;Long Term:  Exercising regularly at least 3-5 days a week.       Increase Strength and Stamina Yes       Intervention Provide advice, education, support and counseling about physical activity/exercise needs.;Develop an individualized exercise prescription for aerobic and resistive training based on initial evaluation findings, risk stratification, comorbidities and participant's personal goals.       Expected Outcomes Short Term: Increase workloads from initial exercise prescription for resistance, speed, and METs.;Short Term: Perform resistance training exercises routinely during rehab and add in resistance training at home;Long Term: Improve cardiorespiratory fitness, muscular endurance and strength as measured by increased METs and functional capacity ( )       Able to understand and use rate of perceived exertion (RPE) scale Yes       Intervention Provide education and explanation on how to use RPE scale       Expected Outcomes Short Term: Able to use RPE daily in rehab to express subjective intensity level;Long Term:  Able to use RPE to guide intensity level when exercising independently       Able to understand and use Dyspnea scale Yes       Intervention Provide education and explanation on how to use Dyspnea scale       Expected Outcomes Short Term: Able to use Dyspnea scale daily in rehab to express subjective sense of shortness of breath during exertion;Long Term: Able to use Dyspnea scale to guide intensity level when exercising independently       Knowledge and understanding of Target Heart Rate Range (THRR) Yes       Intervention Provide education and explanation of THRR including how the numbers were predicted and where they are located for reference       Expected Outcomes Short Term: Able to state/look up THRR;Short Term: Able to use daily as guideline for intensity in rehab;Long Term: Able to use THRR to govern intensity when exercising independently       Able to check pulse independently Yes        Intervention Provide education and demonstration on how to check pulse in carotid and radial arteries.;Review the importance of being able to check your own pulse for safety during independent exercise       Expected Outcomes Short Term: Able to explain why pulse checking is important during independent exercise;Long Term: Able to check pulse independently and accurately       Understanding of Exercise Prescription Yes       Intervention Provide education, explanation, and written materials on patient's individual exercise prescription       Expected Outcomes Short Term: Able to explain program exercise prescription;Long Term: Able to explain home exercise prescription to exercise independently          Exercise Goals Re-Evaluation :  Exercise Goals Re-Evaluation     Row Name 07/16/23 1720 07/26/23 1049 08/09/23 1500 08/23/23 1248 09/06/23 0810     Exercise Goal Re-Evaluation   Exercise Goals Review Able to understand and use rate of perceived exertion (RPE) scale;Knowledge and understanding of Target Heart Rate  Range (THRR);Increase Physical Activity;Understanding of Exercise Prescription;Increase Strength and Stamina;Able to check pulse independently Increase Physical Activity;Understanding of Exercise Prescription;Increase Strength and Stamina Increase Physical Activity;Understanding of Exercise Prescription;Increase Strength and Stamina Increase Physical Activity;Understanding of Exercise Prescription;Increase Strength and Stamina Increase Physical Activity;Understanding of Exercise Prescription;Increase Strength and Stamina   Comments Reviewed RPE and dyspnea scale, THR and program prescription with pt today.  Pt voiced understanding and was given a copy of goals to take home. Connor Blake is off to a good start in the program. He tolerated his exercise prescription well. He was able to do level 2 on the biostep and T6 nustep. He also was able to do 20 laps on the track. We will continue to  monitor his progress in the program. Connor Blake is doing well in rehab. He was only able to attend one session during this weeks review period. During the session he was able to increase from 20 to 23 laps on the track in 15 minutes. We will continue to monitor his progress in the program. Connor Blake is doing well in the program. He was only able to attend one session during this review period. During his session he was able to use the T6 nustep at level 2. We will continue to monitor his progress in the program. Connor Blake is doing well in the program. He has continued to walk up to 23 laps on the track. He also was able to continue to work at level 2 on the T6 nustep and biostep. We will continue to monitor his progress in the program.   Expected Outcomes Short: Use RPE daily to regulate intensity.  Long: Follow program prescription in THR. Short: Continue to follow current exercise prescription. Long: Continue exercise to improve strength and stamina. Short: Continue to follow current exercise prescription. Long: Continue exercise to improve strength and stamina. Short: Attend rehab more consistently. Long: Continue exercise to improve strength and stamina. Short: Begin to push for more laps on the track. Long: Continue exercise to improve strength and stamina.    Row Name 09/19/23 1004             Exercise Goal Re-Evaluation   Exercise Goals Review Increase Physical Activity;Understanding of Exercise Prescription;Increase Strength and Stamina       Comments Connor Blake was not able to attend the rehab during this review period. He last attended on 6/02. We will continue to stay in touch with him to determine his status in the program.       Expected Outcomes Short: Return to rehab. Long: Continue exercise to improve strength and stamina.          Discharge Exercise Prescription (Final Exercise Prescription Changes):  Exercise Prescription Changes - 09/06/23 0800       Response to Exercise   Blood Pressure  (Admit) 160/100    Blood Pressure (Exercise) 210/108    Blood Pressure (Exit) 162/102    Heart Rate (Admit) 80 bpm    Heart Rate (Exercise) 132 bpm    Heart Rate (Exit) 78 bpm    Rating of Perceived Exertion (Exercise) 13    Symptoms none    Duration Continue with 30 min of aerobic exercise without signs/symptoms of physical distress.    Intensity THRR unchanged      Progression   Progression Continue to progress workloads to maintain intensity without signs/symptoms of physical distress.    Average METs 2.72      Resistance Training   Training Prescription Yes    Weight 5 lb  Reps 10-15      Interval Training   Interval Training No      NuStep   Level 2   T6   Minutes 15    METs 2.6      Biostep-RELP   Level 2    Minutes 15    METs 3      Track   Laps 23    Minutes 15    METs 2.25      Oxygen   Maintain Oxygen Saturation 88% or higher          Nutrition:  Target Goals: Understanding of nutrition guidelines, daily intake of sodium 1500mg , cholesterol 200mg , calories 30% from fat and 7% or less from saturated fats, daily to have 5 or more servings of fruits and vegetables.  Education: All About Nutrition: -Group instruction provided by verbal, written material, interactive activities, discussions, models, and posters to present general guidelines for heart healthy nutrition including fat, fiber, MyPlate, the role of sodium in heart healthy nutrition, utilization of the nutrition label, and utilization of this knowledge for meal planning. Follow up email sent as well. Written material given at graduation.   Biometrics:  Pre Biometrics - 07/12/23 1636       Pre Biometrics   Height 6' 1.62 (1.87 m)    Weight 485 lb 14.4 oz (220.4 kg)    Waist Circumference 63 inches    Hip Circumference 69 inches    Waist to Hip Ratio 0.91 %    BMI (Calculated) 63.03    Single Leg Stand 5.3 seconds           Nutrition Therapy Plan and Nutrition Goals:  Nutrition  Therapy & Goals - 07/25/23 1703       Nutrition Therapy   Diet Cardiac, low Na    Protein (specify units) 90    Fiber 30 grams    Whole Grain Foods 3 servings    Saturated Fats 15 max. grams    Fruits and Vegetables 5 servings/day    Sodium 2 grams      Personal Nutrition Goals   Nutrition Goal Eat 15-30gProtein and 30-60gCarbs at each meal.    Personal Goal #2 Include more healthy fats to help increase calorie intake to meet body's needs.    Personal Goal #3 Read labels and reduce sodium intake to below 2300mg . Ideally 1500mg  per day.    Comments Patient reports he has not been drinking soda but still drinks sweet tea and juice. Spoke with him about sugary beverages and how it is terrible for weight and blood sugar control. He agrees and says he will work on drinking more water instead. He takes fluid pills and watches how much water he takes in. Asked him what he knew about sodium. He reports he knows that he should stay away from it. Educated on sodium and provided guideline limit of less than 1500mg . He states he wants to lose weight and be heart healthy. Reviewed his recall, and spoke with him about his eating patterns. He currently eats one meal per day and might have a snack as well. Discussed the importance of eating patterns and structured routine. Set goal to start eating in the morning and mid day. Suggested he keep his dinner as is. Given his body's increased needs recommended healthy fats as a way to help increase his caloric intake without making him eat more than he currently can. While he wants to lose weight he can not achieve a healthy weight loss  with such low calorie intake from one meal. Reviewed facts label and educated on how to read and identify foods with high calorie density that would also be low in sodium and saturated fats.      Intervention Plan   Intervention Prescribe, educate and counsel regarding individualized specific dietary modifications aiming towards  targeted core components such as weight, hypertension, lipid management, diabetes, heart failure and other comorbidities.;Nutrition handout(s) given to patient.    Expected Outcomes Short Term Goal: Understand basic principles of dietary content, such as calories, fat, sodium, cholesterol and nutrients.;Short Term Goal: A plan has been developed with personal nutrition goals set during dietitian appointment.;Long Term Goal: Adherence to prescribed nutrition plan.          Nutrition Assessments:  MEDIFICTS Score Key: >=70 Need to make dietary changes  40-70 Heart Healthy Diet <= 40 Therapeutic Level Cholesterol Diet   Picture Your Plate Scores: <59 Unhealthy dietary pattern with much room for improvement. 41-50 Dietary pattern unlikely to meet recommendations for good health and room for improvement. 51-60 More healthful dietary pattern, with some room for improvement.  >60 Healthy dietary pattern, although there may be some specific behaviors that could be improved.    Nutrition Goals Re-Evaluation:  Nutrition Goals Re-Evaluation     Row Name 08/27/23 1759             Goals   Comment Connor Blake states that before he was eating one meal a day. He has started adding breakfast (yogurt and granola) so now he is up to eating 2 meals a day. He was encouraged add small healthy snacks thoughout the day. He has been reading food labels and chosing lower sodium choices. He also now only occationally drinks sweet tea, not every day.       Expected Outcome Short: add healthy snacks thoughout the day. Long: maintain heart healthy diet.          Nutrition Goals Discharge (Final Nutrition Goals Re-Evaluation):  Nutrition Goals Re-Evaluation - 08/27/23 1759       Goals   Comment Connor Blake states that before he was eating one meal a day. He has started adding breakfast (yogurt and granola) so now he is up to eating 2 meals a day. He was encouraged add small healthy snacks thoughout the day. He has  been reading food labels and chosing lower sodium choices. He also now only occationally drinks sweet tea, not every day.    Expected Outcome Short: add healthy snacks thoughout the day. Long: maintain heart healthy diet.          Psychosocial: Target Goals: Acknowledge presence or absence of significant depression and/or stress, maximize coping skills, provide positive support system. Participant is able to verbalize types and ability to use techniques and skills needed for reducing stress and depression.   Education: Stress, Anxiety, and Depression - Group verbal and visual presentation to define topics covered.  Reviews how body is impacted by stress, anxiety, and depression.  Also discusses healthy ways to reduce stress and to treat/manage anxiety and depression.  Written material given at graduation.   Education: Sleep Hygiene -Provides group verbal and written instruction about how sleep can affect your health.  Define sleep hygiene, discuss sleep cycles and impact of sleep habits. Review good sleep hygiene tips.    Initial Review & Psychosocial Screening:  Initial Psych Review & Screening - 07/04/23 1456       Initial Review   Current issues with None Identified  Family Dynamics   Good Support System? Yes    Comments He can look to his daughter, mother, wife and everyone in his family. Patient does not take anything for his mental state.      Barriers   Psychosocial barriers to participate in program There are no identifiable barriers or psychosocial needs.;The patient should benefit from training in stress management and relaxation.      Screening Interventions   Interventions Provide feedback about the scores to participant;To provide support and resources with identified psychosocial needs;Encouraged to exercise    Expected Outcomes Short Term goal: Utilizing psychosocial counselor, staff and physician to assist with identification of specific Stressors or current  issues interfering with healing process. Setting desired goal for each stressor or current issue identified.;Long Term Goal: Stressors or current issues are controlled or eliminated.;Short Term goal: Identification and review with participant of any Quality of Life or Depression concerns found by scoring the questionnaire.;Long Term goal: The participant improves quality of Life and PHQ9 Scores as seen by post scores and/or verbalization of changes          Quality of Life Scores:   Scores of 19 and below usually indicate a poorer quality of life in these areas.  A difference of  2-3 points is a clinically meaningful difference.  A difference of 2-3 points in the total score of the Quality of Life Index has been associated with significant improvement in overall quality of life, self-image, physical symptoms, and general health in studies assessing change in quality of life.  PHQ-9: Review Flowsheet       08/27/2023 07/12/2023  Depression screen PHQ 2/9  Decreased Interest 1 1  Down, Depressed, Hopeless 0 0  PHQ - 2 Score 1 1  Altered sleeping 1 2  Tired, decreased energy 2 2  Change in appetite 0 0  Feeling bad or failure about yourself  1 1  Trouble concentrating 0 0  Moving slowly or fidgety/restless 0 0  Suicidal thoughts 0 0  PHQ-9 Score 5 6  Difficult doing work/chores Not difficult at all Somewhat difficult   Interpretation of Total Score  Total Score Depression Severity:  1-4 = Minimal depression, 5-9 = Mild depression, 10-14 = Moderate depression, 15-19 = Moderately severe depression, 20-27 = Severe depression   Psychosocial Evaluation and Intervention:  Psychosocial Evaluation - 07/04/23 1457       Psychosocial Evaluation & Interventions   Interventions Relaxation education;Stress management education;Encouraged to exercise with the program and follow exercise prescription    Comments He can look to his daughter, mother, wife and everyone in his family. Patient does not  take anything for his mental state.    Expected Outcomes Short: Start [facility] to help with mood. Long: Maintain a healthy mental state    Continue Psychosocial Services  Follow up required by staff          Psychosocial Re-Evaluation:  Psychosocial Re-Evaluation     Row Name 07/25/23 1732 08/27/23 1757           Psychosocial Re-Evaluation   Current issues with None Identified None Identified      Comments Connor Blake has just started cardiac rehab and is getting established in an exercise routine. He states that he sleeps well at night and uses his CPAP. He did not identify any stress or mental health concerns. Connor Blake states that he has no new stess, sleep, or mental health concerns. He has a positive attitude. He has been out of class for a  few weeks working on med changes to get his BP down. He was back in class today and tolerated exercise well.      Expected Outcomes Short: attend cardiac rehab regulary for the mental health benefits of exercise. Long: maintain good mental health and sleep routine. Short: attend cardiac rehab consistently to recieve mental health benefits from exercise. Long: maintain good mental health routine.      Interventions Encouraged to attend Cardiac Rehabilitation for the exercise Encouraged to attend Cardiac Rehabilitation for the exercise      Continue Psychosocial Services  Follow up required by staff Follow up required by staff         Psychosocial Discharge (Final Psychosocial Re-Evaluation):  Psychosocial Re-Evaluation - 08/27/23 1757       Psychosocial Re-Evaluation   Current issues with None Identified    Comments Connor Blake states that he has no new stess, sleep, or mental health concerns. He has a positive attitude. He has been out of class for a few weeks working on med changes to get his BP down. He was back in class today and tolerated exercise well.    Expected Outcomes Short: attend cardiac rehab consistently to recieve mental health benefits  from exercise. Long: maintain good mental health routine.    Interventions Encouraged to attend Cardiac Rehabilitation for the exercise    Continue Psychosocial Services  Follow up required by staff          Vocational Rehabilitation: Provide vocational rehab assistance to qualifying candidates.   Vocational Rehab Evaluation & Intervention:   Education: Education Goals: Education classes will be provided on a variety of topics geared toward better understanding of heart health and risk factor modification. Participant will state understanding/return demonstration of topics presented as noted by education test scores.  Learning Barriers/Preferences:  Learning Barriers/Preferences - 07/04/23 1455       Learning Barriers/Preferences   Learning Barriers Sight    Learning Preferences None          General Cardiac Education Topics:  AED/CPR: - Group verbal and written instruction with the use of models to demonstrate the basic use of the AED with the basic ABC's of resuscitation.   Anatomy and Cardiac Procedures: - Group verbal and visual presentation and models provide information about basic cardiac anatomy and function. Reviews the testing methods done to diagnose heart disease and the outcomes of the test results. Describes the treatment choices: Medical Management, Angioplasty, or Coronary Bypass Surgery for treating various heart conditions including Myocardial Infarction, Angina, Valve Disease, and Cardiac Arrhythmias.  Written material given at graduation.   Medication Safety: - Group verbal and visual instruction to review commonly prescribed medications for heart and lung disease. Reviews the medication, class of the drug, and side effects. Includes the steps to properly store meds and maintain the prescription regimen.  Written material given at graduation.   Intimacy: - Group verbal instruction through game format to discuss how heart and lung disease can affect  sexual intimacy. Written material given at graduation..   Know Your Numbers and Heart Failure: - Group verbal and visual instruction to discuss disease risk factors for cardiac and pulmonary disease and treatment options.  Reviews associated critical values for Overweight/Obesity, Hypertension, Cholesterol, and Diabetes.  Discusses basics of heart failure: signs/symptoms and treatments.  Introduces Heart Failure Zone chart for action plan for heart failure.  Written material given at graduation. Flowsheet Row Cardiac Rehab from 07/18/2023 in [facility] Cardiac [facility] Pul[facility] Rehab  Date 07/18/23  Educator SB  Instruction Review Code 1- Verbalizes Understanding    Infection Prevention: - Provides verbal and written material to individual with discussion of infection control including proper hand washing and proper equipment cleaning during exercise session. Flowsheet Row Cardiac Rehab from 07/18/2023 in [facility] Cardiac and Pulmonary Rehab  Date 07/12/23  Educator MB  Instruction Review Code 1- Verbalizes Understanding    Falls Prevention: - Provides verbal and written material to individual with discussion of falls prevention and safety. Flowsheet Row Cardiac Rehab from 07/18/2023 in [facility] Cardiac and Pulmonary Rehab  Date 07/12/23  Educator MB  Instruction Review Code 1- Verbalizes Understanding    Other: -Provides group and verbal instruction on various topics (see comments)   Knowledge Questionnaire Score:   Core Components/Risk Factors/Patient Goals at Admission:  Personal Goals and Risk Factors at Admission - 07/12/23 1637       Core Components/Risk Factors/Patient Goals on Admission    Weight Management Yes;Weight Loss;Obesity    Intervention Weight Management: Develop a combined nutrition and exercise program designed to reach desired caloric intake, while maintaining appropriate intake of nutrient and fiber, sodium and fats, and appropriate energy expenditure required for the weight  goal.;Weight Management: Provide education and appropriate resources to help participant work on and attain dietary goals.;Weight Management/Obesity: Establish reasonable short term and long term weight goals.;Obesity: Provide education and appropriate resources to help participant work on and attain dietary goals.    Admit Weight 485 lb 14.4 oz (220.4 kg)    Goal Weight: Short Term 475 lb 14.4 oz (215.9 kg)    Goal Weight: Long Term 465 lb 14.4 oz (211.3 kg)    Expected Outcomes Short Term: Continue to assess and modify interventions until short term weight is achieved;Weight Loss: Understanding of general recommendations for a balanced deficit meal plan, which promotes 1-2 lb weight loss per week and includes a negative energy balance of [phone] kcal/d;Understanding recommendations for meals to include 15-35% energy as protein, 25-35% energy from fat, 35-60% energy from carbohydrates, less than 200mg  of dietary cholesterol, 20-35 gm of total fiber daily;Understanding of distribution of calorie intake throughout the day with the consumption of 4-5 meals/snacks;Long Term: Adherence to nutrition and physical activity/exercise program aimed toward attainment of established weight goal    Heart Failure Yes    Intervention Provide a combined exercise and nutrition program that is supplemented with education, support and counseling about heart failure. Directed toward relieving symptoms such as shortness of breath, decreased exercise tolerance, and extremity edema.    Expected Outcomes Improve functional capacity of life;Short term: Attendance in program 2-3 days a week with increased exercise capacity. Reported lower sodium intake. Reported increased fruit and vegetable intake. Reports medication compliance.;Short term: Daily weights obtained and reported for increase. Utilizing diuretic protocols set by physician.;Long term: Adoption of self-care skills and reduction of barriers for early signs and symptoms  recognition and intervention leading to self-care maintenance.          Education:Diabetes - Individual verbal and written instruction to review signs/symptoms of diabetes, desired ranges of glucose level fasting, after meals and with exercise. Acknowledge that pre and post exercise glucose checks will be done for 3 sessions at entry of program.   Core Components/Risk Factors/Patient Goals Review:   Goals and Risk Factor Review     Row Name 07/25/23 1729 08/27/23 1801           Core Components/Risk Factors/Patient Goals Review   Personal Goals Review Heart Failure Heart Failure  Review Kenyatta reports that he does have a scale at home and he weighs about 4 times a week. He took home a brochure about heart failure symptoms last week and feels comfortable identifying any heart failure symptoms or concerns to communicate to his doctor. Connor Blake reports he continues to  weight several times a week and has the handout for heart failure signs and symptoms to look out for. He know to call his doctor if there are changes in weight or has concerns with any heart failure symptoms.      Expected Outcomes Short: continue to attend cardiac rehab and continue to monitor weight at home. Long: control cardiac risk factors. Short: continue to monitor weight at home. Long: control cardiac risk factors.         Core Components/Risk Factors/Patient Goals at Discharge (Final Review):   Goals and Risk Factor Review - 08/27/23 1801       Core Components/Risk Factors/Patient Goals Review   Personal Goals Review Heart Failure    Review Connor Blake reports he continues to  weight several times a week and has the handout for heart failure signs and symptoms to look out for. He know to call his doctor if there are changes in weight or has concerns with any heart failure symptoms.    Expected Outcomes Short: continue to monitor weight at home. Long: control cardiac risk factors.          ITP Comments:  ITP  Comments     Row Name 07/04/23 1454 07/12/23 1630 07/16/23 1717 08/01/23 0822 08/29/23 0911   ITP Comments Virtual Visit completed. Patient informed on EP and RD appointment and 6 Minute walk test. Patient also informed of patient health questionnaires on My Chart. Patient Verbalizes understanding. Visit diagnosis can be found in [facility] 06/12/2023. Completed and gym orientation for cardiac rehab. Initial ITP created and sent for review to Dr. Oneil Pinal, Medical Director. First full day of exercise!  Patient was oriented to gym and equipment including functions, settings, policies, and procedures.  Patient's individual exercise prescription and treatment plan were reviewed.  All starting workloads were established based on the results of the 6 minute walk test done at initial orientation visit.  The plan for exercise progression was also introduced and progression will be customized based on patient's performance and goals. 30 Day review completed. Medical Director ITP review done, changes made as directed, and signed approval by Medical Director.    new to program 30 Day review completed. Medical Director ITP review done, changes made as directed, and signed approval by Medical Director.    Row Name 09/26/23 0747 10/03/23 1143         ITP Comments 30 Day review completed. Medical Director ITP review done, changes made as directed, and signed approval by Medical Director. Mr. Yoshino is being discharged at this time due to lack of attendance. Staff has tried to contact him with no success. He completd 8 of 36 sessions.         Comments: Early Discharge ITP

## 2023-10-08 ENCOUNTER — Encounter

## 2023-10-10 ENCOUNTER — Encounter

## 2023-10-15 ENCOUNTER — Encounter

## 2023-10-17 ENCOUNTER — Encounter

## 2023-10-22 ENCOUNTER — Encounter

## 2023-10-24 ENCOUNTER — Encounter

## 2023-10-29 ENCOUNTER — Encounter

## 2023-10-31 ENCOUNTER — Encounter

## 2023-11-05 ENCOUNTER — Encounter

## 2023-11-07 ENCOUNTER — Encounter

## 2024-02-26 NOTE — Progress Notes (Signed)
 Chief Complaint  Patient presents with  . Fatigue    Loss of appetite, vomiting and diarrhea - x 4 days    Connor Blake is a 48 y.o. male in clinic today for an acute visit.  HPI: History of Present Illness Connor Blake is a 48 year old male with congestive heart failure who presents with persistent vomiting and abdominal pain.  He has been experiencing persistent vomiting and some abdominal pain since Saturday, which has prevented him from eating. The abdominal pain worsens when lying on his stomach. No fever or unusual food intake, but he has had diarrhea. He can only keep down water and ginger ale, vomiting after consuming orange soda and chicken and rice soup. He is concerned about dehydration but urinates frequently.  He has a persistent cough and has been unable to take his regular medications due to the need to eat with them. He stopped taking Theraflu due to concerns about his congestive heart failure. He has not taken Lasix  for three days and has not noticed significant leg swelling. Despite not taking Lasix , he urinates frequently.  He works two jobs, one at [employer] and another at [employer], which require standing and moving around. He has been unable to work for two days due to his symptoms and wants to return to work once he feels better.   ROS: Review of Systems  Constitutional:  Negative for chills and fever.  HENT:  Negative for congestion and sore throat.   Respiratory:  Positive for cough.   Gastrointestinal:  Positive for diarrhea, nausea and vomiting.     No Known Allergies  Past Medical History:  Diagnosis Date  . Keratoconus of both eyes   . Morbid obesity (CMS-HCC)   . Sleep apnea     BP 102/68 (BP Location: Left forearm, Patient Position: Sitting, BP Cuff Size: Adult)   Pulse 79   Temp 37.2 C (98.9 F) (Temporal)   Ht 186.7 cm (6' 1.5)   Wt (!) 234 kg (515 lb 12.8 oz)   SpO2 98%   BMI 67.12 kg/m    Physical Exam Constitutional:       Appearance: Normal appearance.  Cardiovascular:     Rate and Rhythm: Normal rate and regular rhythm.     Heart sounds: Normal heart sounds.  Pulmonary:     Effort: Pulmonary effort is normal.     Breath sounds: Normal breath sounds.  Abdominal:     General: Abdomen is flat.     Tenderness: There is abdominal tenderness in the epigastric area and left upper quadrant.  Neurological:     Mental Status: He is alert.    Assessment & Plan Acute gastroenteritis Suspected due to nausea, vomiting, diarrhea, and abdominal pain for four days. Likely viral or food-related. No fever, blood in stool, or significant dehydration. Discussed there are no medications that will help resolves his symptoms and supportive care is recommended. Also discussed we could do a CBC and BMP to get white count and electrolytes and kidney function but will hold off on that for now and will start with supportive care. - Prescribed antiemetic up to three times daily for nausea. - Advised hydration with water and electrolyte solutions. - Recommended bland diet: bread, rice, chicken soup. - Provided work note for illness absence. - Patient should go to the ER if he continues to not able to drink water for IV fluids. - Follow up if symptoms worsen or do not improve.  Powell Snell, PA-C

## 2024-03-04 ENCOUNTER — Inpatient Hospital Stay: Payer: MEDICAID

## 2024-03-04 ENCOUNTER — Inpatient Hospital Stay
Admission: EM | Admit: 2024-03-04 | Discharge: 2024-03-09 | DRG: 291 | Disposition: A | Discharge status: Home / Self Care | Payer: MEDICAID | Source: Ambulatory Visit | Attending: Hospitalist | Admitting: Hospitalist

## 2024-03-04 ENCOUNTER — Emergency Department: Payer: MEDICAID

## 2024-03-04 ENCOUNTER — Other Ambulatory Visit: Payer: Self-pay

## 2024-03-04 DIAGNOSIS — G4733 Obstructive sleep apnea (adult) (pediatric): Secondary | ICD-10-CM

## 2024-03-04 DIAGNOSIS — Z91148 Patient's other noncompliance with medication regimen for other reason: Secondary | ICD-10-CM

## 2024-03-04 DIAGNOSIS — R140 Abdominal distension (gaseous): Secondary | ICD-10-CM

## 2024-03-04 DIAGNOSIS — I5043 Acute on chronic combined systolic (congestive) and diastolic (congestive) heart failure: Secondary | ICD-10-CM | POA: Diagnosis present

## 2024-03-04 DIAGNOSIS — E8770 Fluid overload, unspecified: Principal | ICD-10-CM

## 2024-03-04 DIAGNOSIS — I10 Essential (primary) hypertension: Secondary | ICD-10-CM | POA: Diagnosis present

## 2024-03-04 DIAGNOSIS — I5A Non-ischemic myocardial injury (non-traumatic): Secondary | ICD-10-CM | POA: Diagnosis present

## 2024-03-04 HISTORY — DX: Chronic combined systolic (congestive) and diastolic (congestive) heart failure: I50.42

## 2024-03-04 HISTORY — DX: Morbid (severe) obesity due to excess calories: E66.01

## 2024-03-04 HISTORY — DX: Essential (primary) hypertension: I10

## 2024-03-04 LAB — BASIC METABOLIC PANEL WITH GFR
Anion gap: 10 (ref 5–15)
BUN: 12 mg/dL (ref 6–20)
CO2: 27 mmol/L (ref 22–32)
Calcium: 8.9 mg/dL (ref 8.9–10.3)
Chloride: 99 mmol/L (ref 98–111)
Creatinine, Ser: 1.17 mg/dL (ref 0.61–1.24)
GFR, Estimated: >60 mL/min (ref >60)
Glucose, Bld: 160 mg/dL — ABNORMAL HIGH (ref 70–99)
Potassium: 4.4 mmol/L (ref 3.5–5.1)
Sodium: 137 mmol/L (ref 135–145)

## 2024-03-04 LAB — CBC
HCT: 41.5 % (ref 39.0–52.0)
Hemoglobin: 12.7 g/dL — ABNORMAL LOW (ref 13.0–17.0)
MCH: 28.5 pg (ref 26.0–34.0)
MCHC: 30.6 g/dL (ref 30.0–36.0)
MCV: 93 fL (ref 80.0–100.0)
Platelets: 169 K/uL (ref 150–400)
RBC: 4.46 MIL/uL (ref 4.22–5.81)
RDW: 14.4 % (ref 11.5–15.5)
WBC: 4.1 K/uL (ref 4.0–10.5)
nRBC: 0 % (ref 0.0–0.2)

## 2024-03-04 LAB — LIPASE, BLOOD: Lipase: 29 U/L (ref 11–51)

## 2024-03-04 LAB — TROPONIN T, HIGH SENSITIVITY
Troponin T High Sensitivity: 36 ng/L — ABNORMAL HIGH (ref 0–19)
Troponin T High Sensitivity: 36 ng/L — ABNORMAL HIGH (ref 0–19)

## 2024-03-04 LAB — HEPATIC FUNCTION PANEL
ALT: 20 U/L (ref 0–44)
AST: 31 U/L (ref 15–41)
Albumin: 3.6 g/dL (ref 3.5–5.0)
Alkaline Phosphatase: 219 U/L — ABNORMAL HIGH (ref 38–126)
Bilirubin, Direct: 1.4 mg/dL — ABNORMAL HIGH (ref 0.0–0.2)
Indirect Bilirubin: 1 mg/dL — ABNORMAL HIGH (ref 0.3–0.9)
Total Bilirubin: 2.4 mg/dL — ABNORMAL HIGH (ref 0.0–1.2)
Total Protein: 7.5 g/dL (ref 6.5–8.1)

## 2024-03-04 LAB — PRO BRAIN NATRIURETIC PEPTIDE: Pro Brain Natriuretic Peptide: 639 pg/mL — ABNORMAL HIGH (ref <300.0)

## 2024-03-04 MED ORDER — ALBUTEROL SULFATE HFA 108 (90 BASE) MCG/ACT IN AERS: 2.0000 | INHALATION_SPRAY | RESPIRATORY_TRACT | Status: DC | PRN

## 2024-03-04 MED ORDER — ENOXAPARIN SODIUM 120 MG/0.8ML IJ SOSY
0.5000 mg/kg | PREFILLED_SYRINGE | INTRAMUSCULAR | Status: DC
  Administered 2024-03-04: 117 mg via SUBCUTANEOUS
  Filled 2024-03-04: qty 0.78

## 2024-03-04 MED ORDER — OXYCODONE-ACETAMINOPHEN 5-325 MG PO TABS: 1.0000 | ORAL_TABLET | ORAL | Status: DC | PRN

## 2024-03-04 MED ORDER — FUROSEMIDE 10 MG/ML IJ SOLN
40.0000 mg | Freq: Two times a day (BID) | INTRAMUSCULAR | Status: DC
  Administered 2024-03-05 – 2024-03-08 (×7): 40 mg via INTRAVENOUS
  Filled 2024-03-04 (×7): qty 4

## 2024-03-04 MED ORDER — CARVEDILOL 6.25 MG PO TABS
25.0000 mg | ORAL_TABLET | Freq: Two times a day (BID) | ORAL | Status: DC
  Administered 2024-03-04 – 2024-03-09 (×10): 25 mg via ORAL
  Filled 2024-03-04 (×2): qty 4
  Filled 2024-03-04: qty 1
  Filled 2024-03-04: qty 4
  Filled 2024-03-04 (×3): qty 1
  Filled 2024-03-04: qty 4
  Filled 2024-03-04: qty 1
  Filled 2024-03-04: qty 4

## 2024-03-04 MED ORDER — IRBESARTAN 150 MG PO TABS
300.0000 mg | ORAL_TABLET | Freq: Every day | ORAL | Status: DC
  Administered 2024-03-04 – 2024-03-09 (×6): 300 mg via ORAL
  Filled 2024-03-04 (×6): qty 2

## 2024-03-04 MED ORDER — IOHEXOL 350 MG/ML SOLN
125.0000 mL | Freq: Once | INTRAVENOUS | Status: AC | PRN
  Administered 2024-03-04: 125 mL via INTRAVENOUS

## 2024-03-04 MED ORDER — ENOXAPARIN SODIUM 40 MG/0.4ML IJ SOSY: 40.0000 mg | PREFILLED_SYRINGE | INTRAMUSCULAR | Status: DC

## 2024-03-04 MED ORDER — FUROSEMIDE 10 MG/ML IJ SOLN
40.0000 mg | Freq: Once | INTRAMUSCULAR | Status: AC
  Administered 2024-03-04: 40 mg via INTRAVENOUS
  Filled 2024-03-04: qty 4

## 2024-03-04 MED ORDER — SPIRONOLACTONE 25 MG PO TABS
25.0000 mg | ORAL_TABLET | Freq: Every day | ORAL | Status: DC
  Administered 2024-03-05 – 2024-03-09 (×5): 25 mg via ORAL
  Filled 2024-03-04 (×5): qty 1

## 2024-03-04 MED ORDER — MORPHINE SULFATE (PF) 2 MG/ML IV SOLN: 2.0000 mg | INTRAVENOUS | Status: DC | PRN

## 2024-03-04 MED ORDER — ALBUTEROL SULFATE (2.5 MG/3ML) 0.083% IN NEBU: 2.5000 mg | INHALATION_SOLUTION | RESPIRATORY_TRACT | Status: DC | PRN

## 2024-03-04 MED ORDER — ONDANSETRON HCL 4 MG/2ML IJ SOLN: 4.0000 mg | Freq: Three times a day (TID) | INTRAMUSCULAR | Status: AC | PRN

## 2024-03-04 MED ORDER — DM-GUAIFENESIN ER 30-600 MG PO TB12: 1.0000 | ORAL_TABLET | Freq: Two times a day (BID) | ORAL | Status: DC | PRN

## 2024-03-04 MED ORDER — HYDRALAZINE HCL 20 MG/ML IJ SOLN
10.0000 mg | INTRAMUSCULAR | Status: DC | PRN
  Administered 2024-03-04: 10 mg via INTRAVENOUS
  Filled 2024-03-04: qty 1

## 2024-03-04 MED ORDER — ACETAMINOPHEN 325 MG PO TABS: 650.0000 mg | ORAL_TABLET | Freq: Four times a day (QID) | ORAL | Status: AC | PRN

## 2024-03-04 NOTE — Progress Notes (Signed)
 PHARMACIST - PHYSICIAN COMMUNICATION  CONCERNING:  Enoxaparin  (Lovenox ) for DVT Prophylaxis    RECOMMENDATION: Patient was prescribed enoxaprin 40mg  q24 hours for VTE prophylaxis.   Filed Weights   03/04/24 1414  Weight: (!) 233.6 kg (515 lb)    Body mass index is 67.95 kg/m.  Estimated Creatinine Clearance: 157.8 mL/min (by C-G formula based on SCr of 1.17 mg/dL).   Based on [facility] policy patient is candidate for enoxaparin  0.5mg /kg TBW SQ every 24 hours based on BMI being >30.   DESCRIPTION: Pharmacy has adjusted enoxaparin  dose per [facility] policy.  Patient is now receiving enoxaparin  117 mg every 24 hours    Keyondre Hepburn Rodriguez-Guzman PharmD, BCPS 03/04/2024 6:55 PM

## 2024-03-04 NOTE — ED Triage Notes (Signed)
 Pt to ED from doctor for shob, fluid retention. States has not been taking lasix , cannot work and take it at the same time. DOE noted. +chest pain

## 2024-03-04 NOTE — Progress Notes (Signed)
 Connor Blake is a 48 y.o. male who is here for a follow up visit  He states that he is having a hard time managing being on the lasix  while he works. He has stopped taking it except for days he doesn't work. Therefore, his weight is up approximately 30 lbs and he is short of breath. He feels swollen all over. He has PND when he tries to lay down at night.   Patient Active Problem List  Diagnosis  . Keratoconus of both eyes  . Morbid obesity (CMS-HCC)  . OSA (obstructive sleep apnea)    Past Medical History:  Diagnosis Date  . Keratoconus of both eyes   . Morbid obesity (CMS-HCC)   . Sleep apnea      Current Outpatient Medications:  .  carvediloL  (COREG ) 25 MG tablet, Take 1 tablet (25 mg total) by mouth 2 (two) times daily with meals, Disp: 60 tablet, Rfl: 11 .  meloxicam (MOBIC) 15 MG tablet, TAKE 1 TABLET BY MOUTH ONCE DAILY AS NEEDED., Disp: 90 tablet, Rfl: 1 .  ondansetron  (ZOFRAN -ODT) 4 MG disintegrating tablet, Take 1 tablet (4 mg total) by mouth every 8 (eight) hours as needed for Nausea for up to 7 days, Disp: 20 tablet, Rfl: 0 .  spironolactone  (ALDACTONE ) 25 MG tablet, Take 1 tablet (25 mg total) by mouth once daily, Disp: 30 tablet, Rfl: 11 .  telmisartan (MICARDIS) 80 MG tablet, Take 1 tablet (80 mg total) by mouth once daily, Disp: 90 tablet, Rfl: 3 .  FUROsemide  (LASIX ) 40 MG tablet, Take 1 tablet (40 mg total) by mouth 2 (two) times daily (Patient not taking: Reported on 03/04/2024), Disp: 60 tablet, Rfl: 11 .  prednisoLONE acetate (PRED FORTE) 1 % ophthalmic suspension, Place 1 drop into the left eye once daily, Disp: 5 mL, Rfl: 11  No Known Allergies  Results for orders placed or performed during the hospital encounter of 12/21/23  Echo complete  Result Value Ref Range   LV Ejection Fraction (%) 40 %   Right Ventricle Systolic Pressure (mmHg) 30 mmHg   Left Atrium Diameter (cm) 6.3 cm   LV End Diastolic Diameter (cm) 5.2 cm   LV End Systolic Diameter (cm) 4.8 cm    LV Septum Wall Thickness (cm) 2.0 cm   LV Posterior Wall Thickness (cm) 1.6 cm   Tricuspid Valve Regurgitation Grade mild    Tricuspid Valve Regurgitation Max Velocity (m/s) 1.9 m/s   Mitral Valve Regurgitation Grade trivial    Mitral Valve Stenosis Grade none    Aortic Valve Regurgitation Grade none    Aortic Valve Stenosis Grade none    Narrative                 [facility]                         [name]                                                                                    Connor  [facility]                              [name]                                                                                DOB: [birth year]  Age: 63                   ECHO-DOPPLER REPORT                             Date: 12/21/2023                                                                                  Male                                                                                       Outpatient                                                                       LOCATION: [facility]                                                                             MD1: KELLER CHAITANYA                                                                          ALLURI                        STUDY: ECHO COMPLETE  SOUND QLTY: Moderate                       ECHO: Yes                                           STRAIN: Yes                           COLOR: Yes                                               3D: Yes                         DOPPLER: Yes                                               BP: 150 / 98                  RV BIOPSY: No                                                HR: 74 BPM                     CONTRAST: No                                            Height: 73 in                        MEDIUM: N/A                                           Weight: 487 lbs                     MACHINE: CDU -  E95-19                                     BSA: 3.1                     ------------------------------------------------------------------------------------------     History: Heart Failure     Reason: Assess LV function   Indication: 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                                    I50.20- Unspecified systolic (congestive) heart.  I42.8- Other cardiomyopathies (CMS/HHS-HCC).                                          CONCLUSION ------------------------------------------------------------------------------- MODERATE LEFT VENTRICULAR SYSTOLIC DYSFUNCTION WITH SEVERE LVH ESTIMATED EF: 40%, CALC EF(3D): 40% ELEVATED LA PRESSURES WITH DIASTOLIC DYSFUNCTION (GRADE 2) NORMAL RIGHT VENTRICULAR SYSTOLIC FUNCTION VALVULAR REGURGITATION: No AR, TRIVIAL MR, TRIVIAL PR, MILD TR ESTIMATED RVSP: 30 mmHg (Normal) NO VALVULAR STENOSIS AORTA SIZE IS 4.3 cm (ABNORMAL) ------------------------------------------------------------------------------------------ ASC AORTA 4.3 cm    NO PRIOR ECHO FOR COMPARISON   ECHOCARDIOGRAPHIC DESCRIPTIONS ----------------------------------------------------------- AORTIC ROOT         Asc Ao Size: DILATED                               Dissection: INDETERMINATE FOR DISSECTION                                               AORTIC VALVE            Leaflets: Tricuspid                               Mobility: Fully Mobile                     Morphology: Normal                                                                                     AR: No AR                                         AS: No AS                               AV Mass: No Masses                     LEFT VENTRICLE                Size: Normal                                                                                    LVH: SEVERE LVH                           Contraction: MODERATE DECREASE  Closest EF: 40%                                      Calc. EF: 40%(3D)                         LV GLS (GE): -9.5%   Normal Range <-18%      Strain Analysis: GLS > -16% Global longitudinal strain is abnormal.                                  LV Mass: No Masses                          Dias. FxClass: RELAXATION ABNORMALITY (GRADE 2) CORRESPONDS TO PSEUDONORMAL               WALL MOTION                            Basal             Mid               Apical               Anterior Septum: Hypokinetic       Hypokinetic       Hypokinetic            Anterior Wall: Hypokinetic       Hypokinetic       Hypokinetic             Lateral Wall: Hypokinetic       Hypokinetic       Hypokinetic           Posterior Wall: Hypokinetic       Hypokinetic                              Inferior Wall: Hypokinetic       Hypokinetic       Hypokinetic          Inferior Septum: Hypokinetic       Hypokinetic                         Rest Rest Score Index: 2.00   MITRAL VALVE            Leaflets: Normal                                  Mobility: Fully Mobile                     Morphology: Normal                                                                                     MAC: MILD  MR: TRIVIAL MR                                    MS: No MS                             MV masses: No Masses                     LEFT ATRIUM                Size: MILDLY ENLARGED                                                                     LA masses: No Masses                     MAIN PA                Size: MODERATELY DILATED                      Diameter: 2.9 cm                   PULMONIC VALVE            Leaflets: UNKNOWN                                 Mobility: Fully Mobile                     Morphology: Normal                                                            PR: TRIVIAL PR                                    PS: No PS                             PV masses: No Masses                      RIGHT VENTRICLE                Size: ENLARGED                               Free Wall: Normal                          Contraction: Normal  TAPSE: 2.2 cm                                                     RV masses: No Masses                     TRICUSPID VALVE            Leaflets: Normal                                  Mobility: Fully Mobile                     Morphology: Normal                                        TR: MILD TR                                       TS: No TS                             TV masses: No Masses                     RIGHT ATRIUM                Size: ENLARGED                               RA masses: No Masses                     PERICARDIUM               Fluid: No Effusion          INFERIOR VENA CAVA                Size: DILATED                                 Max Diam: 2.9 cm                                  Min Diam: 2.4 cm                       Percent Change: 15 %                                                                        Resp.Collapse: ABNORMAL RESPIRATORY COLLAPSE  RESTING ECHOCARDIOGRAPHIC MEASUREMENTS --------------------------------------------------- AORTA Measurements            Values    Units     Normal Range                               Aorta Sin: 4         cm        [2.8 - 4.0]                                Asc.Aorta: 4.3       cm        [2.2 - 3.8]                           Asc. Aorta BSA: 1.4       cm/m2     [1.1 - 1.9]                    LEFT VENTRICLE                  LVIDd: 5.2       cm        [4.2 - 5.8]                                    LVIDs: 4.8       cm        [2.5- 4]                                   LVIDd/BSA: 1.7       cm/m2                                                      SWT: 2         cm        [0.6 - 1]                                        PWT: 1.6       cm        [0.6 - 1]                                    LV EF 3D: 40        %                                                    LV EDV 3D: 185       mL  LV EDV 3Di: 59        mL/m2                                                LV ESV 3D: 111       mL                                                  LV ESV 3Di: 35        mL/m2                                    DIASTOLIC FUNCTION          MV Pk. E Vel.: 76        cm/s                                             MV Pk. A Vel.: 43.8      cm/s                                                    MV E/A: 1.7                                                                MV DT: 121       msec                                            MV Med E' Vel.: 4.8       cm/s                                               MV Med E/e': 15.9                                                       MV Lat E'Vel.: 12.9      cm/s                                               MV Lat E/e': 5.9  MV Avg E/e': 8.5       cm/s                                     LEFT ATRIUM                LA Diam: 6.3       cm        [3 - 4]                                      LA Area: 34.1      cm2       [<= 20]                                    LA Volume: 127       ml        [18 - 58]                                       LAVi: 40        ml/m2     [16 - 34]                      RIGHT VENTRICLE               RV TAPSE: 2.2       cm                                                     RV Base: 5.8       cm        [2.5 - 4.1]                                   RV Mid: 3.4       cm        [1.9 - 3.5]                    RIGHT ATRIUM                RA Area: 32.7      cm2       [ <= 20]                                   RA Volume: 124       mL                                                        RAVi: 39        mL/m2     [11 - 39]  INFERIOR VENA CAVA                Max.IVC: 2.9       cm        [  <= 2.1]                                    Min.IVC: 2.4       cm        [ <= 1.7]                             Percent Change: 15        %                                        Pressures, Gradients, and DOPPLER ECHO ---------------------------------------------------  Mitral Valve          MV Pk. E Vel.: 76        cm/s                                             MV Pk. A Vel.: 43.8      cm/s                                                    MV E/A: 1.7                                                     MV Inflow E Vel.: 76        cm/s                                         MV Annulus E'Vel.: 4.8       cm/s                                                 E/E'Ratio: 16                                                 Tricuspid Regurgitation Values            TR Pk. Vel.: 1.9       m/s                                                RA Pressure: 15  mmHg                                                      RVSP: 30        mmHg      Peak                           3D acquisition and reconstructions were performed as part of this examination to more accurately quantify the effects of heart failure regardless of ejection fraction with independent workstation.         Perform By: Frances Capri, RCS                                                   Res. Person: Frances Capri, RCS                                              Electronically signed by Elna CANDIE Pinks, M.D. on:12/21/2023 1:14:10 PM with status of Final  The images are stored in the [facility] system, please contact the clinical provider for images related to this study.           Review of Systems  No fever, chills, nausea, or vomiting   Exam  Vitals:   03/04/24 1221  BP: 124/84  Pulse: 84  SpO2: 96%  Weight: (!) 236.4 kg (521 lb 3.2 oz)  Height: 186.7 cm (6' 1.5)    Body mass index is 67.83 kg/m.  General: Patient is well-groomed, well-nourished, appears stated age in no acute  distress  HEENT: head is atraumatic and normocephalic, neck is supple with no palpable nodules  CV: Regular rhythm and normal heart rate, no murmur, 2+ edema noted bilaterally.  Respiratory: Diminished breath sounds at bases.   Impression/Plan  Acute CHF exacerbation: He is volume overloaded because he has not been taking his medicine. His weight is up approximately 30 lbs over baseline and he is short of breath. I discussed the option of going to the hospital for IV diuretics versus doing a few days of bumex with his lasix . His current regimen would be effective if he was compliant. We need to get him back down to ~ 490 lbs to feel like he is back to his baseline fluid status. He will likely need FMLA paperwork once he is out of the hospital so that he can get some accommodations at work to be able to use the restroom when he needs to do so and have closer access.   This visit was part of longitudinal care for this patient involving long-term management of chronic medical conditions.

## 2024-03-04 NOTE — H&P (Incomplete)
 History and Physical    PAPA PIERCEFIELD FMW:[id number] DOB: Sep 09, 1975 DOA: 03/04/2024  Referring MD/NP/PA:   PCP: [facility]   Patient coming from:  The patient is coming from home.     Chief Complaint:   HPI: Connor Blake is a 48 y.o. male with medical history significant of      Data reviewed independently and ED Course: pt was found to have     ***       EKG: I have personally reviewed.  Not done in ED, will get one.   ***   Review of Systems:   General: no fevers, chills, no body weight gain, has poor appetite, has fatigue HEENT: no blurry vision, hearing changes or sore throat Respiratory: no dyspnea, coughing, wheezing CV: no chest pain, no palpitations GI: no nausea, vomiting, abdominal pain, diarrhea, constipation GU: no dysuria, burning on urination, increased urinary frequency, hematuria  Ext: no leg edema Neuro: no unilateral weakness, numbness, or tingling, no vision change or hearing loss Skin: no rash, no skin tear. MSK: No muscle spasm, no deformity, no limitation of range of movement in spin Heme: No easy bruising.  Travel history: No recent long distant travel.   Allergy: No Known Allergies  Past Medical History:  Diagnosis Date   CHF (congestive heart failure) (HCC)    Medical history non-contributory    Morbid obesity with BMI of 60.0-69.9, adult [facility])     Past Surgical History:  Procedure Laterality Date   LEFT HEART CATH AND CORONARY ANGIOGRAPHY Left 06/28/2023   Procedure: LEFT HEART CATH AND CORONARY ANGIOGRAPHY;  Surgeon: Florencio Cara BIRCH, MD;  Location: [facility] INVASIVE CV LAB;  Service: Cardiovascular;  Laterality: Left;   NO PAST SURGERIES      Social History:  reports that he has never smoked. He has never used smokeless tobacco. He reports that he does not drink alcohol and does not use drugs.  Family History: History reviewed. No pertinent family history.   Prior to Admission medications   Medication Sig Start  Date End Date Taking? Authorizing Provider  carvedilol  (COREG ) 25 MG tablet Take 25 mg by mouth 2 (two) times daily. 12/11/23  Yes [provider]  furosemide  (LASIX ) 40 MG tablet Take 1 tablet by mouth 2 (two) times daily. 06/12/23 06/11/24 Yes [provider]  meloxicam (MOBIC) 15 MG tablet Take 15 mg by mouth daily as needed for pain.   Yes [provider]  spironolactone  (ALDACTONE ) 25 MG tablet Take 1 tablet by mouth daily. 07/05/23 07/04/24 Yes [provider]  telmisartan (MICARDIS) 80 MG tablet Take 80 mg by mouth daily. 12/14/23 12/13/24 Yes [provider]  losartan (COZAAR) 50 MG tablet Take 1 tablet by mouth daily. Patient not taking: Reported on 03/04/2024 08/17/23 08/16/24  [provider]  ondansetron  (ZOFRAN -ODT) 4 MG disintegrating tablet Take 4 mg by mouth.  Take 1 tablet (4 mg total) by mouth every 8 (eight) hours as needed for Nausea for up to 7 days Patient not taking: Reported on 03/04/2024 02/26/24 03/04/24  [provider]  prednisoLONE acetate (PRED FORTE) 1 % ophthalmic suspension Place 1 drop into the left eye daily. Patient not taking: Reported on 07/04/2023 06/14/22   [provider]    Physical Exam: Vitals:   03/04/24 1414  BP: (!) 186/133  Pulse: 83  Resp: (!) 22  Temp: 97.9 F (36.6 C)  SpO2: 100%  Weight: (!) 233.6 kg  Height: 6' 1 (1.854 m)   General:  Not in acute distress HEENT:       Eyes: PERRL, EOMI, no jaundice       ENT: No discharge from the ears and nose, no pharynx injection, no tonsillar enlargement.        Neck: No JVD, no bruit, no mass felt. Heme: No neck lymph node enlargement. Cardiac: S1/S2, RRR, No murmurs, No gallops or rubs. Respiratory: No rales, wheezing, rhonchi or rubs. GI: Soft, nondistended, nontender, no rebound pain, no organomegaly, BS present. GU: No hematuria Ext: No pitting leg edema bilaterally. 1+DP/PT pulse bilaterally. Musculoskeletal: No joint  deformities, No joint redness or warmth, no limitation of ROM in spin. Skin: No rashes.  Neuro: Alert, oriented X3, cranial nerves II-XII grossly intact, moves all extremities normally. Muscle strength 5/5 in all extremities, sensation to light touch intact. Brachial reflex 2+ bilaterally. Knee reflex 1+ bilaterally. Negative Babinski's sign. Normal finger to nose test. Psych: Patient is not psychotic, no suicidal or hemocidal ideation.  Labs on Admission: I have personally reviewed following labs and imaging studies  CBC: Recent Labs  Lab 03/04/24 1417  WBC 4.1  HGB 12.7*  HCT 41.5  MCV 93.0  PLT 169   Basic Metabolic Panel: Recent Labs  Lab 03/04/24 1417  NA 137  K 4.4  CL 99  CO2 27  GLUCOSE 160*  BUN 12  CREATININE 1.17  CALCIUM 8.9   GFR: Estimated Creatinine Clearance: 157.8 mL/min (by C-G formula based on SCr of 1.17 mg/dL). Liver Function Tests: Recent Labs  Lab 03/04/24 1710  AST 31  ALT 20  ALKPHOS 219*  BILITOT 2.4*  PROT 7.5  ALBUMIN 3.6   No results for input(s): LIPASE, AMYLASE in the last 168 hours. No results for input(s): AMMONIA in the last 168 hours. Coagulation Profile: No results for input(s): INR, PROTIME in the last 168 hours. Cardiac Enzymes: No results for input(s): CKTOTAL, CKMB, CKMBINDEX, TROPONINI in the last 168 hours. BNP (last 3 results) Recent Labs    03/04/24 1710  PROBNP 639.0*   HbA1C: No results for input(s): HGBA1C in the last 72 hours. CBG: No results for input(s): GLUCAP in the last 168 hours. Lipid Profile: No results for input(s): CHOL, HDL, LDLCALC, TRIG, CHOLHDL, LDLDIRECT in the last 72 hours. Thyroid Function Tests: No results for input(s): TSH, T4TOTAL, FREET4, T3FREE, THYROIDAB in the last 72 hours. Anemia Panel: No results for input(s): VITAMINB12, FOLATE, FERRITIN, TIBC, IRON, RETICCTPCT in the last 72 hours. Urine analysis: No results found for:  COLORURINE, APPEARANCEUR, LABSPEC, PHURINE, GLUCOSEU, HGBUR, BILIRUBINUR, KETONESUR, PROTEINUR, UROBILINOGEN, NITRITE, LEUKOCYTESUR Sepsis Labs: @LABRCNTIP (procalcitonin:4,lacticidven:4) )No results found for this or any previous visit (from the past 240 hours).   Radiological Exams on Admission:   Assessment/Plan Active Problems:   Morbid obesity with BMI of 60.0-69.9, adult (HCC)   Assessment and Plan: No notes have been filed under this hospital service. Service: Hospitalist      Active Problems:   Morbid obesity with BMI of 60.0-69.9, adult (HCC)    DVT ppx: SQ Heparin          SQ Lovenox   Code Status: Full code   ***  Family Communication:     not done, no family member is at bed side.              Yes, patient's    at bed side.       by phone   ***  Disposition Plan:  Anticipate discharge back to previous environment  Consults called:  Admission status and Level of care: :    for obs as inpt        Dispo: The patient is from: {From:23814}              Anticipated d/c is to: {To:23815}              Anticipated d/c date is: {Days:23816}              Patient currently {Medically stable:23817}    Severity of Illness:  {Observation/Inpatient:21159}       Date of Service 03/04/2024    Caleb Exon [facility]   If 7PM-7AM, please contact night-coverage [email or web address] 03/04/2024, 6:37 PM

## 2024-03-04 NOTE — ED Provider Notes (Signed)
 [facility] Provider Note    Event Date/Time   First MD Initiated Contact with Patient 03/04/24 1629     (approximate)   History   Shortness of Breath and fluid retention   HPI  Connor Blake is a 48 y.o. male with CHF with ejection fraction of 40% morbid obesity, nonischemic cardiomyopathy who presents to the emergency department with several weeks of increased weight gain, shortness of breath, abdominal distention lower leg edema.  Patient states that while working 2 jobs he has been too anxious to take his Lasix  as prescribed (40 mg of Lasix  twice daily) as he is afraid of going to the bathroom given his size.  Subsequently he has not taken any of his Lasix  for over a month.  He denies any chest pain, SI HI or AVH S.  He presents with his wife who contributes to the history      Physical Exam   Triage Vital Signs: ED Triage Vitals [03/04/24 1414]  Encounter Vitals Group     BP (!) 186/133     Girls Systolic BP Percentile      Girls Diastolic BP Percentile      Boys Systolic BP Percentile      Boys Diastolic BP Percentile      Pulse Rate 83     Resp (!) 22     Temp 97.9 F (36.6 C)     Temp src      SpO2 100 %     Weight (!) 515 lb (233.6 kg)     Height 6' 1 (1.854 m)     Head Circumference      Peak Flow      Pain Score 4     Pain Loc      Pain Education      Exclude from Growth Chart     Most recent vital signs: Vitals:   03/04/24 2015 03/04/24 2015  BP: (!) 196/121   Pulse:    Resp:    Temp:  97.7 F (36.5 C)  SpO2:      Nursing Triage Note reviewed. Vital signs reviewed and patients oxygen saturation is normoxic  General: Patient is well nourished, well developed, awake and alert, resting comfortably in no acute distress Head: Normocephalic and atraumatic Eyes: Normal inspection, extraocular muscles intact, no conjunctival pallor Ear, nose, throat: Normal external exam Neck: Normal range of motion Respiratory: Patient  is in no respiratory distress, lungs with mild rales Cardiovascular: Patient is not tachycardic, RR GI: Very distended abd SNT with no guarding or rebound  Back: Normal inspection of the back with good strength and range of motion throughout all ext Extremities: pulses intact with good cap refills, 3+ lower extremity edema, no erythema no tenderness Neuro: The patient is alert and oriented to person, place, and time, appropriately conversive, with 5/5 bilat UE/LE strength, no gross motor or sensory defects noted. Coordination appears to be adequate. Skin: Warm, dry, and intact Psych: normal mood and affect, no SI or HI  ED Results / Procedures / Treatments   Labs (all labs ordered are listed, but only abnormal results are displayed) Labs Reviewed  BASIC METABOLIC PANEL WITH GFR - Abnormal; Notable for the following components:      Result Value   Glucose, Bld 160 (*)    All other components within normal limits  CBC - Abnormal; Notable for the following components:   Hemoglobin 12.7 (*)    All other components within normal limits  PRO BRAIN NATRIURETIC PEPTIDE - Abnormal; Notable for the following components:   Pro Brain Natriuretic Peptide 639.0 (*)    All other components within normal limits  HEPATIC FUNCTION PANEL - Abnormal; Notable for the following components:   Alkaline Phosphatase 219 (*)    Total Bilirubin 2.4 (*)    Bilirubin, Direct 1.4 (*)    Indirect Bilirubin 1.0 (*)    All other components within normal limits  TROPONIN T, HIGH SENSITIVITY - Abnormal; Notable for the following components:   Troponin T High Sensitivity 36 (*)    All other components within normal limits  TROPONIN T, HIGH SENSITIVITY - Abnormal; Notable for the following components:   Troponin T High Sensitivity 36 (*)    All other components within normal limits  MAGNESIUM  HIV ANTIBODY (ROUTINE TESTING W REFLEX)  BASIC METABOLIC PANEL WITH GFR  CBC     EKG EKG and rhythm strip are  interpreted by myself:   EKG: [Normal sinus rhythm] at heart rate of 89, normal QRS duration, QTc 442, nonspecific ST segments and T waves no ectopy EKG not consistent with Acute STEMI Rhythm strip: NSR in lead II   RADIOLOGY Chest x-ray: Mild cardiomegaly on my independent review interpretation radiologist agrees    PROCEDURES:  Critical Care performed: No  Procedures   MEDICATIONS ORDERED IN ED: Medications  dextromethorphan-guaiFENesin  (MUCINEX  DM) 30-600 MG per 12 hr tablet 1 tablet (has no administration in time range)  ondansetron  (ZOFRAN ) injection 4 mg (has no administration in time range)  hydrALAZINE  (APRESOLINE ) injection 10 mg (10 mg Intravenous Given 03/04/24 2018)  acetaminophen  (TYLENOL ) tablet 650 mg (has no administration in time range)  albuterol  (PROVENTIL ) (2.5 MG/3ML) 0.083% nebulizer solution 2.5 mg (has no administration in time range)  furosemide  (LASIX ) injection 40 mg (has no administration in time range)  enoxaparin  (LOVENOX ) injection 117 mg (117 mg Subcutaneous Given 03/04/24 2013)  furosemide  (LASIX ) injection 40 mg (40 mg Intravenous Given 03/04/24 1759)     IMPRESSION / MDM / ASSESSMENT AND PLAN / ED COURSE                                Differential diagnosis includes, but is not limited to, CHF exacerbation, electrolyte derangement anemia, pneumonia, congestive heart failure   ED course: Patient appears hypervolemic on presentation and has had an increase in weight.  His BNP surprisingly is not significantly elevated however suspect he has right sided heart failure given his abdominal distention and leg edema.  EKG without evidence of acute ischemia.  He does have slightly elevated bilirubin however does not tender in his abdomen suspect this is secondary to fluid overload.  I did give him a dose of IV fluid and put on a condom catheter.  Case discussed with hospitalist will continue diuresis  Clinical Course as of 03/04/24 2021  Tue Mar 04, 2024  1821 Pro Brain Natriuretic Peptide(!): 639.0 Not significantly elevated [HD]  1843 Case discussed with hospitalist for admission [HD]    Clinical Course User Index [HD] Nicholaus Rolland BRAVO, MD   -- Risk: 5 This patient has a high risk of morbidity due to further diagnostic testing or treatment. Rationale: This patient's evaluation and management involve a high risk of morbidity due to the potential severity of presenting symptoms, need for diagnostic testing, and/or initiation of treatment that may require close monitoring. The differential includes conditions with potential for significant deterioration or requiring  escalation of care. Treatment decisions in the ED, including medication administration, procedural interventions, or disposition planning, reflect this level of risk. COPA: 5 The patient has the following acute or chronic illness/injury that poses a possible threat to life or bodily function: [X] : The patient has a potentially serious acute condition or an acute exacerbation of a chronic illness requiring urgent evaluation and management in the Emergency Department. The clinical presentation necessitates immediate consideration of life-threatening or function-threatening diagnoses, even if they are ultimately ruled out.   FINAL CLINICAL IMPRESSION(S) / ED DIAGNOSES   Final diagnoses:  Hypervolemia, unspecified hypervolemia type  Abdominal distension  H/O medication noncompliance     Rx / DC Orders   ED Discharge Orders     None        Note:  This document was prepared using Dragon voice recognition software and may include unintentional dictation errors.   Nicholaus Rolland BRAVO, MD 03/04/24 2021

## 2024-03-05 ENCOUNTER — Inpatient Hospital Stay
Admit: 2024-03-05 | Discharge: 2024-03-05 | Disposition: A | Discharge status: Home / Self Care | Payer: MEDICAID | Attending: Internal Medicine | Admitting: Internal Medicine

## 2024-03-05 ENCOUNTER — Encounter: Payer: Self-pay | Admitting: Internal Medicine

## 2024-03-05 ENCOUNTER — Inpatient Hospital Stay: Payer: MEDICAID

## 2024-03-05 ENCOUNTER — Other Ambulatory Visit (HOSPITAL_COMMUNITY): Payer: Self-pay

## 2024-03-05 DIAGNOSIS — R109 Unspecified abdominal pain: Secondary | ICD-10-CM | POA: Diagnosis present

## 2024-03-05 LAB — ECHOCARDIOGRAM COMPLETE
Height: 73 in
S' Lateral: 4.05 cm
Weight: 8240 [oz_av]

## 2024-03-05 LAB — URINALYSIS, COMPLETE (UACMP) WITH MICROSCOPIC
Bilirubin Urine: NEGATIVE
Glucose, UA: NEGATIVE mg/dL
Hgb urine dipstick: NEGATIVE
Ketones, ur: NEGATIVE mg/dL
Leukocytes,Ua: NEGATIVE
Nitrite: NEGATIVE
Protein, ur: NEGATIVE mg/dL
RBC / HPF: 0 RBC/hpf (ref 0–5)
Specific Gravity, Urine: 1.006 (ref 1.005–1.030)
Squamous Epithelial / HPF: 0 /HPF (ref 0–5)
pH: 7 (ref 5.0–8.0)

## 2024-03-05 LAB — BASIC METABOLIC PANEL WITH GFR
Anion gap: 10 (ref 5–15)
BUN: 11 mg/dL (ref 6–20)
CO2: 28 mmol/L (ref 22–32)
Calcium: 9.1 mg/dL (ref 8.9–10.3)
Chloride: 99 mmol/L (ref 98–111)
Creatinine, Ser: 1.16 mg/dL (ref 0.61–1.24)
GFR, Estimated: >60 mL/min (ref >60)
Glucose, Bld: 118 mg/dL — ABNORMAL HIGH (ref 70–99)
Potassium: 3.8 mmol/L (ref 3.5–5.1)
Sodium: 138 mmol/L (ref 135–145)

## 2024-03-05 LAB — CBC
HCT: 38.9 % — ABNORMAL LOW (ref 39.0–52.0)
Hemoglobin: 12.5 g/dL — ABNORMAL LOW (ref 13.0–17.0)
MCH: 29.2 pg (ref 26.0–34.0)
MCHC: 32.1 g/dL (ref 30.0–36.0)
MCV: 90.9 fL (ref 80.0–100.0)
Platelets: 180 K/uL (ref 150–400)
RBC: 4.28 MIL/uL (ref 4.22–5.81)
RDW: 14.6 % (ref 11.5–15.5)
WBC: 4.3 K/uL (ref 4.0–10.5)
nRBC: 0 % (ref 0.0–0.2)

## 2024-03-05 LAB — HEPATIC FUNCTION PANEL
ALT: 17 U/L (ref 0–44)
AST: 24 U/L (ref 15–41)
Albumin: 3.5 g/dL (ref 3.5–5.0)
Alkaline Phosphatase: 202 U/L — ABNORMAL HIGH (ref 38–126)
Bilirubin, Direct: 1.4 mg/dL — ABNORMAL HIGH (ref 0.0–0.2)
Indirect Bilirubin: 1.1 mg/dL — ABNORMAL HIGH (ref 0.3–0.9)
Total Bilirubin: 2.5 mg/dL — ABNORMAL HIGH (ref 0.0–1.2)
Total Protein: 7.3 g/dL (ref 6.5–8.1)

## 2024-03-05 LAB — PROTIME-INR
INR: 1.3 — ABNORMAL HIGH (ref 0.8–1.2)
Prothrombin Time: 17.3 s — ABNORMAL HIGH (ref 11.4–15.2)

## 2024-03-05 LAB — APTT: aPTT: 33 s (ref 24–36)

## 2024-03-05 LAB — HIV ANTIBODY (ROUTINE TESTING W REFLEX): HIV Screen 4th Generation wRfx: NONREACTIVE

## 2024-03-05 LAB — MAGNESIUM: Magnesium: 1.9 mg/dL (ref 1.7–2.4)

## 2024-03-05 MED ORDER — MORPHINE SULFATE (PF) 2 MG/ML IV SOLN: 2.0000 mg | INTRAVENOUS | Status: AC | PRN

## 2024-03-05 MED ORDER — OXYCODONE-ACETAMINOPHEN 5-325 MG PO TABS: 1.0000 | ORAL_TABLET | ORAL | Status: AC | PRN

## 2024-03-05 MED ORDER — PIPERACILLIN-TAZOBACTAM 3.375 G IVPB
3.3750 g | Freq: Three times a day (TID) | INTRAVENOUS | Status: DC
  Administered 2024-03-05 – 2024-03-07 (×7): 3.375 g via INTRAVENOUS
  Filled 2024-03-05 (×7): qty 50

## 2024-03-05 MED ORDER — HYDRALAZINE HCL 20 MG/ML IJ SOLN: 10.0000 mg | INTRAMUSCULAR | Status: DC | PRN

## 2024-03-05 NOTE — Consult Note (Signed)
 Date of Consultation:  03/05/2024  Requesting Physician:  Greig Free, DO  Reason for Consultation:  Cholelithiasis  History of Present Illness: Connor Blake is a 48 y.o. male presenting yesterday to the emergency room with shortness of breath and chest pain.  Patient also reports having left lower quadrant abdominal pain.  Patient reports that he stopped taking his Lasix  and has gained 20 pounds over the last week.  Also right after Thanksgiving, he had an episode of what could have been gastroenteritis because he had a lot of vomiting and nausea but he has not had any issues with nausea or vomiting over the past week.  In the emergency room, he had a CT scan of the abdomen pelvis which showed cholelithiasis with mild Abran cholecystic fluid or stranding.  However on personal review of his CT scan images, he has diffuse edematous changes within the abdominal wall and also within the mesentery of his abdomen.  He also had an ultrasound of the right upper quadrant which showed a contracted gallbladder with cholelithiasis and a wall thickness of 3.1 mm.  Yesterday he total bilirubin of 2.4, with a direct component of 1.4, but otherwise normal AST and ALT and with an elevated alkaline phosphatase of 219.  Today, his LFTs remain overall stable with a T. bili of 2.5, direct component 1.4, and alkaline phosphatase 202.  His white blood cell count has remained normal 4.1 yesterday and 4.3 today.  His proBNP is 639.  He denies having any pain in the right upper quadrant or in the epigastric area and his pain has only been in the left lower quadrant.  Past Medical History: Past Medical History:  Diagnosis Date   Chronic combined systolic and diastolic congestive heart failure (HCC)    HTN (hypertension)    Medical history non-contributory    Morbid obesity with BMI of 60.0-69.9, adult [facility])      Past Surgical History: Past Surgical History:  Procedure Laterality Date   LEFT HEART CATH AND CORONARY  ANGIOGRAPHY Left 06/28/2023   Procedure: LEFT HEART CATH AND CORONARY ANGIOGRAPHY;  Surgeon: Florencio Cara BIRCH, MD;  Location: [facility] INVASIVE CV LAB;  Service: Cardiovascular;  Laterality: Left;   NO PAST SURGERIES      Home Medications: Prior to Admission medications   Medication Sig Start Date End Date Taking? Authorizing Provider  carvedilol  (COREG ) 25 MG tablet Take 25 mg by mouth 2 (two) times daily. 12/11/23  Yes [provider]  furosemide  (LASIX ) 40 MG tablet Take 1 tablet by mouth 2 (two) times daily. 06/12/23 06/11/24 Yes [provider]  meloxicam (MOBIC) 15 MG tablet Take 15 mg by mouth daily as needed for pain.   Yes [provider]  spironolactone  (ALDACTONE ) 25 MG tablet Take 1 tablet by mouth daily. 07/05/23 07/04/24 Yes [provider]  telmisartan (MICARDIS) 80 MG tablet Take 80 mg by mouth daily. 12/14/23 12/13/24 Yes [provider]  losartan (COZAAR) 50 MG tablet Take 1 tablet by mouth daily. Patient not taking: Reported on 03/04/2024 08/17/23 08/16/24  [provider]  prednisoLONE acetate (PRED FORTE) 1 % ophthalmic suspension Place 1 drop into the left eye daily. Patient not taking: Reported on 07/04/2023 06/14/22   [provider]    Allergies: No Known Allergies  Social History:  reports that he has never smoked. He has never used smokeless tobacco. He reports that he does not drink alcohol and does not use drugs.   Family History: Family History  Problem  Relation Age of Onset   Hypertension Mother    Heart failure Father     Review of Systems: Review of Systems  Constitutional:  Negative for chills and fever.  HENT:  Negative for hearing loss.   Respiratory:  Positive for shortness of breath.   Cardiovascular:  Positive for chest pain.  Gastrointestinal:  Positive for abdominal pain, nausea and vomiting.  Genitourinary:  Negative for dysuria.  Musculoskeletal:  Negative for myalgias.  Skin:  Negative  for rash.  Neurological:  Negative for dizziness.  Psychiatric/Behavioral:  Negative for depression.     Physical Exam BP (!) 145/94 (BP Location: Right Arm)   Pulse 67   Temp 98.4 F (36.9 C) (Oral)   Resp 20   Ht 6' 1 (1.854 m)   Wt (!) 233.6 kg   SpO2 100%   BMI 67.95 kg/m  CONSTITUTIONAL: No acute distress HEENT:  Normocephalic, atraumatic, extraocular motion intact. NECK: Trachea is midline, and there is no jugular venous distension. RESPIRATORY:  Normal respiratory effort without pathologic use of accessory muscles. CARDIOVASCULAR: Regular rhythm and rate. GI: The abdomen is soft, obese, nondistended, with mild discomfort in the left lower quadrant.  No peritonitis.  He has no pain in the right upper quadrant or in the epigastric area.  Negative Murphy's sign.  MUSCULOSKELETAL:  Normal muscle strength and tone in all four extremities.  Patient has bilateral lower extremity edema. SKIN: Skin turgor is normal. There are no pathologic skin lesions.  NEUROLOGIC:  Motor and sensation is grossly normal.  Cranial nerves are grossly intact. PSYCH:  Alert and oriented to person, place and time. Affect is normal.  Laboratory Analysis: Results for orders placed or performed during the hospital encounter of 03/04/24 (from the past 24 hours)  Troponin T, High Sensitivity     Status: Abnormal   Collection Time: 03/04/24  5:10 PM  Result Value Ref Range   Troponin T High Sensitivity 36 (H) 0 - 19 ng/L  Pro Brain natriuretic peptide     Status: Abnormal   Collection Time: 03/04/24  5:10 PM  Result Value Ref Range   Pro Brain Natriuretic Peptide 639.0 (H) <300.0 pg/mL  Hepatic function panel     Status: Abnormal   Collection Time: 03/04/24  5:10 PM  Result Value Ref Range   Total Protein 7.5 6.5 - 8.1 g/dL   Albumin 3.6 3.5 - 5.0 g/dL   AST 31 15 - 41 U/L   ALT 20 0 - 44 U/L   Alkaline Phosphatase 219 (H) 38 - 126 U/L   Total Bilirubin 2.4 (H) 0.0 - 1.2 mg/dL   Bilirubin, Direct  1.4 (H) 0.0 - 0.2 mg/dL   Indirect Bilirubin 1.0 (H) 0.3 - 0.9 mg/dL  Lipase, blood     Status: None   Collection Time: 03/04/24  5:10 PM  Result Value Ref Range   Lipase 29 11 - 51 U/L  HIV Antibody (routine testing w rflx)     Status: None   Collection Time: 03/04/24  6:46 PM  Result Value Ref Range   HIV Screen 4th Generation wRfx Non Reactive Non Reactive  Magnesium     Status: None   Collection Time: 03/05/24  5:54 AM  Result Value Ref Range   Magnesium 1.9 1.7 - 2.4 mg/dL  Basic metabolic panel     Status: Abnormal   Collection Time: 03/05/24  5:54 AM  Result Value Ref Range   Sodium 138 135 - 145 mmol/L   Potassium 3.8  3.5 - 5.1 mmol/L   Chloride 99 98 - 111 mmol/L   CO2 28 22 - 32 mmol/L   Glucose, Bld 118 (H) 70 - 99 mg/dL   BUN 11 6 - 20 mg/dL   Creatinine, Ser 8.83 0.61 - 1.24 mg/dL   Calcium 9.1 8.9 - 89.6 mg/dL   GFR, Estimated >39 >39 mL/min   Anion gap 10 5 - 15  CBC     Status: Abnormal   Collection Time: 03/05/24  5:54 AM  Result Value Ref Range   WBC 4.3 4.0 - 10.5 K/uL   RBC 4.28 4.22 - 5.81 MIL/uL   Hemoglobin 12.5 (L) 13.0 - 17.0 g/dL   HCT 61.0 (L) 60.9 - 47.9 %   MCV 90.9 80.0 - 100.0 fL   MCH 29.2 26.0 - 34.0 pg   MCHC 32.1 30.0 - 36.0 g/dL   RDW 85.3 88.4 - 84.4 %   Platelets 180 150 - 400 K/uL   nRBC 0.0 0.0 - 0.2 %  Protime-INR     Status: Abnormal   Collection Time: 03/05/24  5:54 AM  Result Value Ref Range   Prothrombin Time 17.3 (H) 11.4 - 15.2 seconds   INR 1.3 (H) 0.8 - 1.2  APTT     Status: None   Collection Time: 03/05/24  5:54 AM  Result Value Ref Range   aPTT 33 24 - 36 seconds  Urinalysis, Complete w Microscopic -Urine, Clean Catch     Status: Abnormal   Collection Time: 03/05/24  5:54 AM  Result Value Ref Range   Color, Urine STRAW (A) YELLOW   APPearance CLEAR (A) CLEAR   Specific Gravity, Urine 1.006 1.005 - 1.030   pH 7.0 5.0 - 8.0   Glucose, UA NEGATIVE NEGATIVE mg/dL   Hgb urine dipstick NEGATIVE NEGATIVE   Bilirubin  Urine NEGATIVE NEGATIVE   Ketones, ur NEGATIVE NEGATIVE mg/dL   Protein, ur NEGATIVE NEGATIVE mg/dL   Nitrite NEGATIVE NEGATIVE   Leukocytes,Ua NEGATIVE NEGATIVE   RBC / HPF 0 0 - 5 RBC/hpf   WBC, UA 0-5 0 - 5 WBC/hpf   Bacteria, UA RARE (A) NONE SEEN   Squamous Epithelial / HPF 0 0 - 5 /HPF  Hepatic function panel     Status: Abnormal   Collection Time: 03/05/24  2:38 PM  Result Value Ref Range   Total Protein 7.3 6.5 - 8.1 g/dL   Albumin 3.5 3.5 - 5.0 g/dL   AST 24 15 - 41 U/L   ALT 17 0 - 44 U/L   Alkaline Phosphatase 202 (H) 38 - 126 U/L   Total Bilirubin 2.5 (H) 0.0 - 1.2 mg/dL   Bilirubin, Direct 1.4 (H) 0.0 - 0.2 mg/dL   Indirect Bilirubin 1.1 (H) 0.3 - 0.9 mg/dL    Imaging: ECHOCARDIOGRAM COMPLETE Result Date: 03/05/2024    ECHOCARDIOGRAM REPORT   Patient Name:   Connor Blake Date of Exam: 03/05/2024 Medical Rec #:  [id number]        Height:       73.0 in Accession #:    [id number]       Weight:       515.0 lb Date of Birth:  Jan 22, 1976        BSA:          3.216 m Patient Age:    48 years         BP:           128/79 mmHg Patient Gender: M  HR:           73 bpm. Exam Location:  [facility] Procedure: 2D Echo, Cardiac Doppler and Color Doppler (Both Spectral and Color            Flow Doppler were utilized during procedure). Indications:     CHF-acute systolic I50.21  History:         Patient has prior history of Echocardiogram examinations, most                  recent 06/05/2023. Risk Factors:Hypertension. Chronic combined                  diastolic and systolic Congestive heart failure.  Sonographer:     Christopher Furnace Referring Phys:  [id number] XILIN NIU Diagnosing Phys: Marsa Dooms MD  Sonographer Comments: Technically challenging study due to limited acoustic windows, no apical window and no subcostal window. Image acquisition challenging due to patient body habitus. IMPRESSIONS  1. Left ventricular ejection fraction, by estimation, is 40 to 45%. The left ventricle has  mildly decreased function. The left ventricle has no regional wall motion abnormalities. Left ventricular diastolic parameters are indeterminate.  2. Right ventricular systolic function is normal. The right ventricular size is normal.  3. The mitral valve is normal in structure. Trivial mitral valve regurgitation. No evidence of mitral stenosis.  4. The aortic valve is normal in structure. Aortic valve regurgitation is not visualized. No aortic stenosis is present.  5. The inferior vena cava is normal in size with greater than 50% respiratory variability, suggesting right atrial pressure of 3 mmHg. FINDINGS  Left Ventricle: Left ventricular ejection fraction, by estimation, is 40 to 45%. The left ventricle has mildly decreased function. The left ventricle has no regional wall motion abnormalities. Strain was performed and the global longitudinal strain is indeterminate. The left ventricular internal cavity size was normal in size. There is no left ventricular hypertrophy. Left ventricular diastolic parameters are indeterminate. Right Ventricle: The right ventricular size is normal. No increase in right ventricular wall thickness. Right ventricular systolic function is normal. Left Atrium: Left atrial size was normal in size. Right Atrium: Right atrial size was normal in size. Pericardium: There is no evidence of pericardial effusion. Mitral Valve: The mitral valve is normal in structure. Trivial mitral valve regurgitation. No evidence of mitral valve stenosis. Tricuspid Valve: The tricuspid valve is normal in structure. Tricuspid valve regurgitation is trivial. No evidence of tricuspid stenosis. Aortic Valve: The aortic valve is normal in structure. Aortic valve regurgitation is not visualized. No aortic stenosis is present. Pulmonic Valve: The pulmonic valve was normal in structure. Pulmonic valve regurgitation is not visualized. No evidence of pulmonic stenosis. Aorta: The aortic root is normal in size and  structure. Venous: The inferior vena cava is normal in size with greater than 50% respiratory variability, suggesting right atrial pressure of 3 mmHg. IAS/Shunts: No atrial level shunt detected by color flow Doppler. Additional Comments: 3D was performed not requiring image post processing on an independent workstation and was indeterminate.  LEFT VENTRICLE PLAX 2D LVIDd:         5.20 cm LVIDs:         4.05 cm LV PW:         0.90 cm LV IVS:        1.60 cm LVOT diam:     3.55 cm LVOT Area:     9.90 cm  LEFT ATRIUM         Index LA  diam:    4.60 cm 1.43 cm/m   AORTA Ao Root diam: 4.30 cm TRICUSPID VALVE TR Peak grad:   20.2 mmHg TR Vmax:        225.00 cm/s  SHUNTS Systemic Diam: 3.55 cm Marsa Dooms MD Electronically signed by Marsa Dooms MD Signature Date/Time: 03/05/2024/1:50:40 PM    Final    US  Abdomen Limited RUQ (LIVER/GB) Result Date: 03/05/2024 CLINICAL DATA:  2 day history of abdominal pain. EXAM: ULTRASOUND ABDOMEN LIMITED RIGHT UPPER QUADRANT COMPARISON:  CT scan from 1 day prior. FINDINGS: Gallbladder: Gallbladder demonstrates an apparent wall echo shadow sign suggesting left the wall is contracted down against intraluminal stones. Gallbladder wall thickness is mildly increased at 3.1 mm. Sonographer reports no sonographic Murphy sign. Common bile duct: Diameter: 4 mm Liver: Increased echogenicity without focal abnormality. Portal vein is patent on color Doppler imaging with normal direction of blood flow towards the liver. Other: None. IMPRESSION: 1. Gallbladder demonstrates an apparent wall echo shadow sign suggesting the wall is contracted down against intraluminal stones. Gallbladder wall thickness is mildly increased at 3.1 mm. Close correlation for signs/symptoms of acute cholecystitis recommended. 2. No biliary dilatation. 3. Increased echogenicity of the liver parenchyma is a nonspecific finding but can be seen in the setting of hepatic steatosis. Electronically Signed   By:  Camellia Candle M.D.   On: 03/05/2024 08:46   CT ABDOMEN PELVIS W CONTRAST Result Date: 03/04/2024 EXAM: CT ABDOMEN AND PELVIS WITH CONTRAST 03/04/2024 11:08:52 PM TECHNIQUE: CT of the abdomen and pelvis was performed with the administration of 125 mL of iohexol  (OMNIPAQUE ) 350 MG/ML injection. Multiplanar reformatted images are provided for review. Automated exposure control, iterative reconstruction, and/or weight-based adjustment of the mA/kV was utilized to reduce the radiation dose to as low as reasonably achievable. COMPARISON: None available. CLINICAL HISTORY: Abdominal pain, acute, nonlocalized. FINDINGS: LOWER CHEST: No acute abnormality. LIVER: Hepatic steatosis. GALLBLADDER AND BILE DUCTS: Cholelithiasis with mild pericholecystic fluid/stranding (image 37), equivocal but at least raising the possibility of acute cholecystitis. No biliary ductal dilatation. SPLEEN: No acute abnormality. PANCREAS: No acute abnormality. ADRENAL GLANDS: No acute abnormality. KIDNEYS, URETERS AND BLADDER: No stones in the kidneys or ureters. No hydronephrosis. No perinephric or periureteral stranding. Urinary bladder is unremarkable. GI AND BOWEL: Stomach demonstrates no acute abnormality. Small periumbilical hernia containing a knuckle of nondilated small bowel (sagittal image 70). There is no bowel obstruction. PERITONEUM AND RETROPERITONEUM: Trace free fluid within the pelvis. No free air. VASCULATURE: Aorta is normal in caliber. LYMPH NODES: Small periaortic nodes, within normal limits. REPRODUCTIVE ORGANS: Prostate is unremarkable. BONES AND SOFT TISSUES: No acute osseous abnormality. Mild body wall edema. Normal appendix (image 73). IMPRESSION: 1. Cholelithiasis with mild pericholecystic fluid/stranding, equivocal but raising the possibility of acute cholecystitis. Consider RUQ ultrasound for further evaluation. 2. Small periumbilical hernia containing a knuckle of nondilated small bowel. No evidence of bowel  obstruction. Electronically signed by: Pinkie Pebbles MD 03/04/2024 11:18 PM EST RP Workstation: [id number]    Assessment and Plan: This is a 48 y.o. male with cholelithiasis.  - Discussed with the patient the findings on his imaging studies.  Although there is some stranding that you can see around the gallbladder on his CT scan, the patient has enough edematous changes throughout the mesentery and the abdominal wall that this would be nonspecific for cholecystitis.  Ultrasound images also are difficult due to his body habitus.  He has never had any pain in the right upper quadrant or in the epigastric  area and has only had pain issues in the left lower quadrant.  As such, I do not think this is related to cholecystitis.  However given the elevated LFTs, it may be prudent to obtain a HIDA scan to fully confirm this. - Discussed with the patient that if the HIDA scan is positive, then we will try to manage him conservatively with IV antibiotics alone or at the most with a percutaneous cholecystostomy drain.  Given his medical issues and significantly elevated BMI, he would not be a good surgical candidate here. - Patient has had breakfast and lunch today without any abdominal issues.  Will order HIDA scan for tomorrow morning so that he can be n.p.o. after midnight. - All of his questions have been answered.  I spent 60 minutes dedicated to the care of this patient on the date of this encounter to include pre-visit review of records, face-to-face time with the patient discussing diagnosis and management, and any post-visit coordination of care.   Aloysius Sheree Plant, MD [facility] Pg:  [phone]

## 2024-03-05 NOTE — Assessment & Plan Note (Signed)
 Continue strict I's and O's Complete echo ordered pending read at this time Lasix  40 mg IV twice daily Continue spironolactone  25 mg daily

## 2024-03-05 NOTE — Hospital Course (Addendum)
 Mr. Mckinnon Glick is a 48 year old male with history of morbid obesity, OSA on CPAP, medication noncompliance, who presents ED for chief concerns of shortness of breath and abdominal pain.  12/9: Patient admitted to [facility] hospitalist service for acute on chronic combined systolic and diastolic congestive heart failure.  Regarding abdominal pain, which was unclear etiology.  CT abdomen showed cholelithiasis with mild pericholecystic fluid/stranding, equivocal.  Patient was started empirically with Zosyn , as needed morphine , Percocet, acetaminophen  for pain.  12/10: I assumed care of the patient.  General surgery has been consulted.  Patient getting complete echo pending read at this time.  Continue Zosyn  IV.  Discussed with general surgery who recommends n.p.o. after midnight.  HIDA scan has been ordered.  Patient is a poor candidate for surgical intervention given morbid obesity and heart failure.  12/11: HIDA scan done did not show any signs of cholecystitis 12/12: Continue IV diuretics, optimize congestive heart failure medication, cardiology consult requested. 12/13: Diuretics have been changed to oral 40 mg twice daily Lasix .  Patient is asymptomatic.  He had a mild AKI.

## 2024-03-05 NOTE — Assessment & Plan Note (Signed)
 -  This complicates overall care and prognosis.

## 2024-03-05 NOTE — Assessment & Plan Note (Signed)
 On the right side of his abdomen Ultrasound showed possible cholecystitis General Surgery has been ordered and he recommends HIDA scan HIDA scan has been ordered Patient will need to be n.p.o. after midnight No pain medication after midnight for HIDA scan All pain medications scheduled to stop at midnight 12/11.

## 2024-03-05 NOTE — Progress Notes (Signed)
 Heart Failure Stewardship Pharmacy Note  PCP: Cletus Glenn PCP-Cardiologist: None  HPI: Connor Blake is a 48 y.o. male with CHF, HTN, obesity, OSA who presented with shortness of breath, fatigue, orthopnea, abdominal distention, and LEE progressing over the past few months. On admission, proBNP was 639, HS-troponin was 36, alk phos 219, Tbili 2.4, and lipase 29. Chest x-ray noted mild central venous congestion. CT AP noted cholelithiasis with mild pericholecystic fluid/stranding, raising the possibility of acute cholecystitis, so US  of the abdomen was ordered.   Pertinent cardiac history: TTE 05/2023 with LVEF of <20%, G1DD. LHC 06/2023 showed no obstructive CAD. TTE 11/2023 after initiation of GDMT with LVEF improved to 40%, severe LVH, G2DD, trivial MR, trivial PR, mild TR.  Pertinent Lab Values: Creatinine, Ser  Date Value Ref Range Status  03/05/2024 1.16 0.61 - 1.24 mg/dL Final   BUN  Date Value Ref Range Status  03/05/2024 11 6 - 20 mg/dL Final   Potassium  Date Value Ref Range Status  03/05/2024 3.8 3.5 - 5.1 mmol/L Final   Sodium  Date Value Ref Range Status  03/05/2024 138 135 - 145 mmol/L Final   Magnesium  Date Value Ref Range Status  03/05/2024 1.9 1.7 - 2.4 mg/dL Final    Comment:    Performed at [facility], [address] [place], [place] [address]    Vital Signs:  Temp:  [97.7 F (36.5 C)-98.2 F (36.8 C)] 98.2 F (36.8 C) (12/10 0750) Pulse Rate:  [72-85] 73 (12/10 0750) Cardiac Rhythm: Normal sinus rhythm (12/09 2231) Resp:  [15-24] 17 (12/10 0750) BP: (128-196)/(79-133) 128/79 (12/10 0750) SpO2:  [97 %-100 %] 99 % (12/10 0750) Weight:  [233.6 kg (515 lb)] 233.6 kg (515 lb) (12/09 1414)  Intake/Output Summary (Last 24 hours) at 03/05/2024 9178 Last data filed at 03/05/2024 0803 Gross per 24 hour  Intake --  Output 7700 ml  Net -7700 ml    Current Heart Failure Medications:  Loop diuretic: furosemide  40 mg IV  q12h Beta-Blocker: carvedilol  25 mg BID ACEI/ARB/ARNI: irbesartan  300 mg daily MRA: spironolactone  25 mg daily SGLT2i: none Other: none  Prior to admission Heart Failure Medications:  Loop diuretic: furosemide  40 mg BID (not taking) Beta-Blocker: carvedilol  25 mg BID ACEI/ARB/ARNI: telmisartan 80 mg daily MRA: spironolactone  25 mg daily (not taking) SGLT2i: none Other: none  Assessment: 1. Acute on chronic combined systolic and diastolic heart failure (LVEF 40%) with G2DD, due to NICM. NYHA class III symptoms.  -Symptoms: Reports shortness of breath and fatigue are improving with diuresis. Reports poor appetite, reduced persistent abdominal swelling, and LEE. -Volume: Still hypervolemic on exam. Currently on furosemide  40 mg IV BID with good urine output and stable creatinine/BUN. Continue current dose at this time. Patient was not taking furosemide  PTA due to having to use the restroom often at work. -Hemodynamics: BP severely elevated on admission, now only mildly elevated. -BB: Continue carvedilol  25 mg BID at this time. Can be increased further when euvolemic. -ACEI/ARB/ARNI: Continue irbesartan  300 mg daily. Would benefit from transition to Entresto 49-51 mg BID if covered on insurance. -MRA: Continue spironolactone  25 mg daily. -SGLT2i: Can consider starting SGLT2i if covered on insurance.  Plan: 1) Medication changes recommended at this time: -None  2) Patient assistance: -Pending  3) Education: - Patient has been educated on current HF medications and potential additions to HF medication regimen - Patient verbalizes understanding that over the next few months, these medication doses may change and more medications may be  added to optimize HF regimen - Patient has been educated on basic disease state pathophysiology and goals of therapy  Please do not hesitate to reach out with questions or concerns,  Jaun Bash, PharmD, CPP, BCPS, [facility] Heart Failure Pharmacist  Phone  - [phone] 03/05/2024 11:07 AM

## 2024-03-05 NOTE — Progress Notes (Signed)
 PROGRESS NOTE  [name]  FMW:[id number] DOB: 1975-06-18 DOA: 03/04/2024 PCP: [facility]   [name] is a 48 year old male with history of morbid obesity, OSA on CPAP, medication noncompliance, who presents ED for chief concerns of shortness of breath and abdominal pain.  12/9: Patient admitted to [facility] for acute on chronic combined systolic and diastolic congestive heart failure.  Regarding abdominal pain, which was unclear etiology.  CT abdomen showed cholelithiasis with mild pericholecystic fluid/stranding, equivocal.  Patient was started empirically with Zosyn , as needed morphine , Percocet, acetaminophen  for pain.  12/10: I assumed care of the patient.  General surgery has been consulted.  Patient getting complete echo pending read at this time.  Continue Zosyn  IV.  Discussed with general surgery who recommends n.p.o. after midnight.  HIDA scan has been ordered.  Patient is a poor candidate for surgical intervention given morbid obesity and heart failure.  N.p.o. after midnight for HIDA scan.  No IV opioid medication after midnight also for HIDA scan.  Assessment & Plan:   Principal Problem:   Acute on chronic combined systolic and diastolic congestive heart failure (HCC) Active Problems:   Morbid obesity with BMI of 60.0-69.9, adult (HCC)   Abdominal pain   OSA on CPAP   HTN (hypertension)   Assessment and Plan:  * Acute on chronic combined systolic and diastolic congestive heart failure (HCC) Continue strict I's and O's Complete echo ordered pending read at this time Lasix  40 mg IV twice daily Continue spironolactone  25 mg daily  Abdominal pain On the right side of his abdomen Ultrasound showed possible cholecystitis General Surgery has been ordered and he recommends HIDA scan HIDA scan has been ordered Patient will need to be n.p.o. after midnight No pain medication after midnight for HIDA scan All pain medications scheduled  to stop at midnight 12/11.  Morbid obesity with BMI of 60.0-69.9, adult (HCC) This complicates overall care and prognosis.   OSA on CPAP CPAP nightly  HTN (hypertension) Carvedilol  25 mg p.o. twice daily, irbesartan  300 mg daily, spironolactone  25 mg daily Hydralazine  10 mg IV every 4 hours as needed for SBP greater than 165  DVT prophylaxis: Pharmacologic DVT not initiated on admission.  AM team to initiate pharmacologic DVT when the benefits outweigh the risk. Code Status: Full code Family Communication: Spouse has been updated at bedside Disposition Plan: Pending clinical course, pending HIDA scan, pending general surgery recommendation Level of care: Telemetry  Consultants:  General Surgery  Procedures:  None at this time  Antimicrobials: 12/10 Zosyn , day 2 of antibiotic  Subjective:  At bedside, patient able to tell me his first last name, age, location, current calendar year.  Patient reports abdominal pain is improved.  He reports still swelling of the lower extremities.  Objective: Vitals:   03/05/24 1050 03/05/24 1130 03/05/24 1430 03/05/24 1530  BP: 120/83 117/79 (!) 141/89 (!) 145/94  Pulse: 75 71 70 67  Resp: 20 18 18 20   Temp:  98.4 F (36.9 C)  98.4 F (36.9 C)  TempSrc:  Oral  Oral  SpO2: 98% 96% 97% 100%  Weight:      Height:        Intake/Output Summary (Last 24 hours) at 03/05/2024 1652 Last data filed at 03/05/2024 1632 Gross per 24 hour  Intake --  Output 8300 ml  Net -8300 ml   Filed Weights   03/04/24 1414  Weight: (!) 233.6 kg   Examination:  General exam: Appears calm and comfortable  Respiratory system: Clear to auscultation. Respiratory effort normal. Cardiovascular system: S1 & S2 heard, RRR. No JVD, murmurs, rubs, gallops or clicks.  Bilateral 1+ pitting edema of the lower extremity Gastrointestinal system: Morbid obesity, abdomen is nondistended, soft and nontender. No organomegaly or masses felt. Normal bowel sounds  heard. Central nervous system: Alert and oriented. No focal neurological deficits. Extremities: Symmetric 5 x 5 power. Skin: No rashes, lesions or ulcers Psychiatry: Judgement and insight appear normal. Mood & affect appropriate.   Data Reviewed: I have personally reviewed following labs and imaging studies  CBC: Recent Labs  Lab 03/04/24 1417 03/05/24 0554  WBC 4.1 4.3  HGB 12.7* 12.5*  HCT 41.5 38.9*  MCV 93.0 90.9  PLT 169 180   Basic Metabolic Panel: Recent Labs  Lab 03/04/24 1417 03/05/24 0554  NA 137 138  K 4.4 3.8  CL 99 99  CO2 27 28  GLUCOSE 160* 118*  BUN 12 11  CREATININE 1.17 1.16  CALCIUM 8.9 9.1  MG  --  1.9   GFR: Estimated Creatinine Clearance: 159.1 mL/min (by C-G formula based on SCr of 1.16 mg/dL).  Liver Function Tests: Recent Labs  Lab 03/04/24 1710 03/05/24 1438  AST 31 24  ALT 20 17  ALKPHOS 219* 202*  BILITOT 2.4* 2.5*  PROT 7.5 7.3  ALBUMIN 3.6 3.5   Recent Labs  Lab 03/04/24 1710  LIPASE 29   Coagulation Profile: Recent Labs  Lab 03/05/24 0554  INR 1.3*   BNP (last 3 results) Recent Labs    03/04/24 1710  PROBNP 639.0*   Radiology Studies: ECHOCARDIOGRAM COMPLETE Result Date: 03/05/2024    ECHOCARDIOGRAM REPORT   Patient Name:   Connor Blake Date of Exam: 03/05/2024 Medical Rec #:  [id number]        Height:       73.0 in Accession #:    [id number]       Weight:       515.0 lb Date of Birth:  10-Jun-1975        BSA:          3.216 m Patient Age:    48 years         BP:           128/79 mmHg Patient Gender: M                HR:           73 bpm. Exam Location:  [facility] Procedure: 2D Echo, Cardiac Doppler and Color Doppler (Both Spectral and Color            Flow Doppler were utilized during procedure). Indications:     CHF-acute systolic I50.21  History:         Patient has prior history of Echocardiogram examinations, most                  recent 06/05/2023. Risk Factors:Hypertension. Chronic combined                   diastolic and systolic Congestive heart failure.  Sonographer:     Christopher Furnace Referring Phys:  [id number] XILIN NIU Diagnosing Phys: Marsa Dooms MD  Sonographer Comments: Technically challenging study due to limited acoustic windows, no apical window and no subcostal window. Image acquisition challenging due to patient body habitus. IMPRESSIONS  1. Left ventricular ejection fraction, by estimation, is 40 to 45%. The left ventricle has mildly decreased function. The left ventricle has no regional wall  motion abnormalities. Left ventricular diastolic parameters are indeterminate.  2. Right ventricular systolic function is normal. The right ventricular size is normal.  3. The mitral valve is normal in structure. Trivial mitral valve regurgitation. No evidence of mitral stenosis.  4. The aortic valve is normal in structure. Aortic valve regurgitation is not visualized. No aortic stenosis is present.  5. The inferior vena cava is normal in size with greater than 50% respiratory variability, suggesting right atrial pressure of 3 mmHg. FINDINGS  Left Ventricle: Left ventricular ejection fraction, by estimation, is 40 to 45%. The left ventricle has mildly decreased function. The left ventricle has no regional wall motion abnormalities. Strain was performed and the global longitudinal strain is indeterminate. The left ventricular internal cavity size was normal in size. There is no left ventricular hypertrophy. Left ventricular diastolic parameters are indeterminate. Right Ventricle: The right ventricular size is normal. No increase in right ventricular wall thickness. Right ventricular systolic function is normal. Left Atrium: Left atrial size was normal in size. Right Atrium: Right atrial size was normal in size. Pericardium: There is no evidence of pericardial effusion. Mitral Valve: The mitral valve is normal in structure. Trivial mitral valve regurgitation. No evidence of mitral valve stenosis. Tricuspid Valve: The  tricuspid valve is normal in structure. Tricuspid valve regurgitation is trivial. No evidence of tricuspid stenosis. Aortic Valve: The aortic valve is normal in structure. Aortic valve regurgitation is not visualized. No aortic stenosis is present. Pulmonic Valve: The pulmonic valve was normal in structure. Pulmonic valve regurgitation is not visualized. No evidence of pulmonic stenosis. Aorta: The aortic root is normal in size and structure. Venous: The inferior vena cava is normal in size with greater than 50% respiratory variability, suggesting right atrial pressure of 3 mmHg. IAS/Shunts: No atrial level shunt detected by color flow Doppler. Additional Comments: 3D was performed not requiring image post processing on an independent workstation and was indeterminate.  LEFT VENTRICLE PLAX 2D LVIDd:         5.20 cm LVIDs:         4.05 cm LV PW:         0.90 cm LV IVS:        1.60 cm LVOT diam:     3.55 cm LVOT Area:     9.90 cm  LEFT ATRIUM         Index LA diam:    4.60 cm 1.43 cm/m   AORTA Ao Root diam: 4.30 cm TRICUSPID VALVE TR Peak grad:   20.2 mmHg TR Vmax:        225.00 cm/s  SHUNTS Systemic Diam: 3.55 cm Marsa Dooms MD Electronically signed by Marsa Dooms MD Signature Date/Time: 03/05/2024/1:50:40 PM    Final    US  Abdomen Limited RUQ (LIVER/GB) Result Date: 03/05/2024 CLINICAL DATA:  2 day history of abdominal pain. EXAM: ULTRASOUND ABDOMEN LIMITED RIGHT UPPER QUADRANT COMPARISON:  CT scan from 1 day prior. FINDINGS: Gallbladder: Gallbladder demonstrates an apparent wall echo shadow sign suggesting left the wall is contracted down against intraluminal stones. Gallbladder wall thickness is mildly increased at 3.1 mm. Sonographer reports no sonographic Murphy sign. Common bile duct: Diameter: 4 mm Liver: Increased echogenicity without focal abnormality. Portal vein is patent on color Doppler imaging with normal direction of blood flow towards the liver. Other: None. IMPRESSION: 1.  Gallbladder demonstrates an apparent wall echo shadow sign suggesting the wall is contracted down against intraluminal stones. Gallbladder wall thickness is mildly increased at 3.1 mm. Close correlation  for signs/symptoms of acute cholecystitis recommended. 2. No biliary dilatation. 3. Increased echogenicity of the liver parenchyma is a nonspecific finding but can be seen in the setting of hepatic steatosis. Electronically Signed   By: Camellia Candle M.D.   On: 03/05/2024 08:46   CT ABDOMEN PELVIS W CONTRAST Result Date: 03/04/2024 EXAM: CT ABDOMEN AND PELVIS WITH CONTRAST 03/04/2024 11:08:52 PM TECHNIQUE: CT of the abdomen and pelvis was performed with the administration of 125 mL of iohexol  (OMNIPAQUE ) 350 MG/ML injection. Multiplanar reformatted images are provided for review. Automated exposure control, iterative reconstruction, and/or weight-based adjustment of the mA/kV was utilized to reduce the radiation dose to as low as reasonably achievable. COMPARISON: None available. CLINICAL HISTORY: Abdominal pain, acute, nonlocalized. FINDINGS: LOWER CHEST: No acute abnormality. LIVER: Hepatic steatosis. GALLBLADDER AND BILE DUCTS: Cholelithiasis with mild pericholecystic fluid/stranding (image 37), equivocal but at least raising the possibility of acute cholecystitis. No biliary ductal dilatation. SPLEEN: No acute abnormality. PANCREAS: No acute abnormality. ADRENAL GLANDS: No acute abnormality. KIDNEYS, URETERS AND BLADDER: No stones in the kidneys or ureters. No hydronephrosis. No perinephric or periureteral stranding. Urinary bladder is unremarkable. GI AND BOWEL: Stomach demonstrates no acute abnormality. Small periumbilical hernia containing a knuckle of nondilated small bowel (sagittal image 70). There is no bowel obstruction. PERITONEUM AND RETROPERITONEUM: Trace free fluid within the pelvis. No free air. VASCULATURE: Aorta is normal in caliber. LYMPH NODES: Small periaortic nodes, within normal limits.  REPRODUCTIVE ORGANS: Prostate is unremarkable. BONES AND SOFT TISSUES: No acute osseous abnormality. Mild body wall edema. Normal appendix (image 73). IMPRESSION: 1. Cholelithiasis with mild pericholecystic fluid/stranding, equivocal but raising the possibility of acute cholecystitis. Consider RUQ ultrasound for further evaluation. 2. Small periumbilical hernia containing a knuckle of nondilated small bowel. No evidence of bowel obstruction. Electronically signed by: Pinkie Pebbles MD 03/04/2024 11:18 PM EST RP Workstation: [id number]   DG Chest 2 View Result Date: 03/04/2024 EXAM: 2 VIEW(S) XRAY OF THE CHEST 03/04/2024 02:48:33 PM COMPARISON: None available. CLINICAL HISTORY: cp FINDINGS: LUNGS AND PLEURA: No focal pulmonary opacity. No pleural effusion. No pneumothorax. HEART AND MEDIASTINUM: Cardiomegaly. Mild central venous congestion. BONES AND SOFT TISSUES: Multilevel degenerative changes of thoracic spine. IMPRESSION: 1. Cardiomegaly with mild central venous congestion. Electronically signed by: Norleen Boxer MD 03/04/2024 03:44 PM EST RP Workstation: [id number]   Scheduled Meds:  carvedilol   25 mg Oral BID WC   furosemide   40 mg Intravenous Q12H   irbesartan   300 mg Oral Daily   spironolactone   25 mg Oral Daily   Continuous Infusions:  piperacillin -tazobactam (ZOSYN )  IV 3.375 g (03/05/24 1347)    LOS: 1 day   Time spent: 50 minutes  Dr. Sherre [facility] If 7PM-7AM, please contact night-coverage 03/05/2024, 4:52 PM

## 2024-03-05 NOTE — Assessment & Plan Note (Signed)
 Carvedilol  25 mg p.o. twice daily, irbesartan  300 mg daily, spironolactone  25 mg daily Hydralazine  10 mg IV every 4 hours as needed for SBP greater than 165

## 2024-03-05 NOTE — ED Notes (Signed)
Echo in progress.

## 2024-03-05 NOTE — ED Notes (Signed)
 RT placed pt on cpap.  Family with pt   meds infusing.

## 2024-03-05 NOTE — Progress Notes (Signed)
*  PRELIMINARY RESULTS* Echocardiogram 2D Echocardiogram has been performed.  Connor Blake 03/05/2024, 10:23 AM

## 2024-03-05 NOTE — Assessment & Plan Note (Signed)
-   CPAP nightly

## 2024-03-06 ENCOUNTER — Inpatient Hospital Stay: Payer: MEDICAID

## 2024-03-06 DIAGNOSIS — K8020 Calculus of gallbladder without cholecystitis without obstruction: Secondary | ICD-10-CM

## 2024-03-06 LAB — CBC
HCT: 40.3 % (ref 39.0–52.0)
Hemoglobin: 12.9 g/dL — ABNORMAL LOW (ref 13.0–17.0)
MCH: 29.1 pg (ref 26.0–34.0)
MCHC: 32 g/dL (ref 30.0–36.0)
MCV: 90.8 fL (ref 80.0–100.0)
Platelets: 172 K/uL (ref 150–400)
RBC: 4.44 MIL/uL (ref 4.22–5.81)
RDW: 14.6 % (ref 11.5–15.5)
WBC: 4.5 K/uL (ref 4.0–10.5)
nRBC: 0 % (ref 0.0–0.2)

## 2024-03-06 LAB — HEPATIC FUNCTION PANEL
ALT: 15 U/L (ref 0–44)
AST: 20 U/L (ref 15–41)
Albumin: 3.4 g/dL — ABNORMAL LOW (ref 3.5–5.0)
Alkaline Phosphatase: 192 U/L — ABNORMAL HIGH (ref 38–126)
Bilirubin, Direct: 1.3 mg/dL — ABNORMAL HIGH (ref 0.0–0.2)
Indirect Bilirubin: 0.9 mg/dL (ref 0.3–0.9)
Total Bilirubin: 2.3 mg/dL — ABNORMAL HIGH (ref 0.0–1.2)
Total Protein: 7.2 g/dL (ref 6.5–8.1)

## 2024-03-06 MED ORDER — TECHNETIUM TC 99M MEBROFENIN IV KIT
7.5000 | PACK | Freq: Once | INTRAVENOUS | Status: AC | PRN
  Administered 2024-03-06: 7.78 via INTRAVENOUS

## 2024-03-06 MED ORDER — MORPHINE SULFATE (PF) 4 MG/ML IV SOLN
3.0000 mg | Freq: Once | INTRAVENOUS | Status: AC
  Administered 2024-03-06: 3 mg via INTRAVENOUS
  Filled 2024-03-06: qty 1

## 2024-03-06 NOTE — Progress Notes (Signed)
 03/06/24  HIDA scan done this afternoon.  The cystic duct is not obstructed and fills during study.  No acute cholecystitis.  No acute surgical needs at this point.  We'll sign off for now, but feel free to reach out if any questions or concerns.  No follow up with us  needed.  Given his BMI and comorbidities, he would need a referral to tertiary center for his cholelithiasis if that becomes an issue.  Aloysius Plant, MD

## 2024-03-06 NOTE — ED Notes (Signed)
 Complete bed change. Suction was off.  Corrected and fixed all the cords etc.  Provided new blankets as well.

## 2024-03-06 NOTE — ED Notes (Signed)
 Patient gone to nuclear med at this time.

## 2024-03-06 NOTE — Progress Notes (Signed)
 [facility] SURGICAL PROGRESS NOTE (cpt [id number])  Hospital Day(s): 2.  Interval History: Patient seen and examined, no acute events or new complaints overnight. Patient reports he is feeling okay. He denied any abdominal pain. No fever, chills, nausea, emesis. He remains without leukocytosis; WBC 4.5K. Hgb to 12.9; stable. Stable hyperbilirubinemia; 2.3; direct 1.3 - stable. He is NPO this AM. Plan for HIDA. He is on Zosyn .   Review of Systems:  Constitutional: denies fever, chills  HEENT: denies cough or congestion  Respiratory: denies any shortness of breath  Cardiovascular: denies chest pain or palpitations  Gastrointestinal: denies abdominal pain, N/V Genitourinary: denies burning with urination or urinary frequency Musculoskeletal: denies pain, decreased motor or sensation  Vital signs in last 24 hours: [min-max] current  Temp:  [97.6 F (36.4 C)-98.4 F (36.9 C)] 97.6 F (36.4 C) (12/11 0831) Pulse Rate:  [67-75] 70 (12/11 0831) Resp:  [17-22] 20 (12/11 0831) BP: (117-177)/(79-110) 177/110 (12/11 0831) SpO2:  [96 %-100 %] 100 % (12/11 0831) FiO2 (%):  [21 %] 21 % (12/10 2100)     Height: 6' 1 (185.4 cm) Weight: (!) 233.6 kg BMI (Calculated): 67.96   Intake/Output last 2 shifts:  12/10 0701 - 12/11 0700 In: -  Out: 5350 [Urine:5350]   Physical Exam:  Constitutional: alert, cooperative and no distress  HENT: normocephalic without obvious abnormality  Respiratory: breathing non-labored at rest  Cardiovascular: regular rate and sinus rhythm  Gastrointestinal: Abdomen is soft, he is non-tender, non-distended. Soft umbilical hernia.    Labs:     Latest Ref Rng & Units 03/06/2024    5:10 AM 03/05/2024    5:54 AM 03/04/2024    2:17 PM  CBC  WBC 4.0 - 10.5 K/uL 4.5  4.3  4.1   Hemoglobin 13.0 - 17.0 g/dL 87.0  87.4  87.2   Hematocrit 39.0 - 52.0 % 40.3  38.9  41.5   Platelets 150 - 400 K/uL 172  180  169       Latest Ref Rng & Units 03/06/2024    5:10  AM 03/05/2024    2:38 PM 03/05/2024    5:54 AM  CMP  Glucose 70 - 99 mg/dL   881   BUN 6 - 20 mg/dL   11   Creatinine 9.38 - 1.24 mg/dL   8.83   Sodium 864 - 854 mmol/L   138   Potassium 3.5 - 5.1 mmol/L   3.8   Chloride 98 - 111 mmol/L   99   CO2 22 - 32 mmol/L   28   Calcium 8.9 - 10.3 mg/dL   9.1   Total Protein 6.5 - 8.1 g/dL 7.2  7.3    Total Bilirubin 0.0 - 1.2 mg/dL 2.3  2.5    Alkaline Phos 38 - 126 U/L 192  202    AST 15 - 41 U/L 20  24    ALT 0 - 44 U/L 15  17      Imaging studies: No new pertinent imaging studies   Assessment/Plan:  48 y.o. male with cholelithiasis, complicated by morbid obesity, cardiac history   - Overall clinical picture does not appear consistent with cholecystitis; however, we will proceed this AM with HIDA scan to definitively rule output cholecystitis.  - If HIDA is negative, okay to resume diet as tolerated and nothing to add from surgical perspective - If HIDA is positive, would recommend continued conservative management with IV Abx. If failure to improve, would recommend percutaneous cholecystostomy  tube. His morbid obesity and cardiac history certainly put him at significantly increased peri-operative risk.   - Continue NPO for now pending HIDA  - Reasonable to control Abx for now  - Pain control prn; limit narcotics prior to HIDA  - Further management per primary service; we will follow   All of the above findings and recommendations were discussed with the patient, patient's family at bedside, and the medical team, and all of patient's and family's questions were answered to their expressed satisfaction.  -- Arthea Platt, PA-C [facility] 03/06/2024, 9:07 AM M-F: 7am - 4pm

## 2024-03-06 NOTE — Progress Notes (Signed)
 Heart Failure Stewardship Pharmacy Note  PCP: Cletus Glenn PCP-Cardiologist: None  HPI: Connor Blake is a 48 y.o. male with CHF, HTN, obesity, OSA who presented with shortness of breath, fatigue, orthopnea, abdominal distention, and LEE progressing over the past few months. On admission, proBNP was 639, HS-troponin was 36, alk phos 219, Tbili 2.4, and lipase 29. Chest x-ray noted mild central venous congestion. CT AP noted cholelithiasis with mild pericholecystic fluid/stranding, raising the possibility of acute cholecystitis, so US  of the abdomen was ordered.   Pertinent cardiac history: TTE 05/2023 with LVEF of <20%, G1DD. LHC 06/2023 showed no obstructive CAD. TTE 11/2023 after initiation of GDMT with LVEF improved to 40%, severe LVH, G2DD, trivial MR, trivial PR, mild TR.  Pertinent Lab Values: Creatinine, Ser  Date Value Ref Range Status  03/05/2024 1.16 0.61 - 1.24 mg/dL Final   BUN  Date Value Ref Range Status  03/05/2024 11 6 - 20 mg/dL Final   Potassium  Date Value Ref Range Status  03/05/2024 3.8 3.5 - 5.1 mmol/L Final   Sodium  Date Value Ref Range Status  03/05/2024 138 135 - 145 mmol/L Final   Magnesium  Date Value Ref Range Status  03/05/2024 1.9 1.7 - 2.4 mg/dL Final    Comment:    Performed at [facility], [address] [place], [place] [address]    Vital Signs:  Temp:  [97.9 F (36.6 C)-98.4 F (36.9 C)] 98.1 F (36.7 C) (12/11 0506) Pulse Rate:  [67-75] 70 (12/11 0745) Cardiac Rhythm: Normal sinus rhythm (12/11 0048) Resp:  [17-22] 17 (12/11 0430) BP: (117-156)/(79-106) 132/84 (12/11 0430) SpO2:  [96 %-100 %] 98 % (12/11 0745) FiO2 (%):  [21 %] 21 % (12/10 2100)  Intake/Output Summary (Last 24 hours) at 03/06/2024 0809 Last data filed at 03/06/2024 9282 Gross per 24 hour  Intake --  Output 4600 ml  Net -4600 ml    Current Heart Failure Medications:  Loop diuretic: none Beta-Blocker: carvedilol  25 mg  BID ACEI/ARB/ARNI: irbesartan  300 mg daily MRA: spironolactone  25 mg daily SGLT2i: none Other: none  Prior to admission Heart Failure Medications:  Loop diuretic: furosemide  40 mg BID (not taking) Beta-Blocker: carvedilol  25 mg BID ACEI/ARB/ARNI: telmisartan 80 mg daily MRA: spironolactone  25 mg daily (not taking) SGLT2i: none Other: none  Assessment: 1. Acute on chronic combined systolic and diastolic heart failure (LVEF 40%) with G2DD, due to NICM. NYHA class III symptoms.  -Symptoms: Reports shortness of breath and fatigue are improving with diuresis. Reports poor appetite, reduced persistent abdominal swelling, and LEE. -Volume: Still appears hypervolemic on exam. Great urine output on furosemide  40 mg IV BID, though it has been held today. Consider checking BMET and restarting furosemide  if creatinine is stable. -Hemodynamics: BP severely elevated on admission, remains high this AM. HR 70s. -BB: Continue carvedilol  25 mg BID at this time. Can be increased further when euvolemic. -ACEI/ARB/ARNI: Continue irbesartan  300 mg daily. Would benefit from transition to Entresto 49-51 mg BID, though patient has no insurance currently.  -MRA: Continue spironolactone  25 mg daily. Can consider increasing to 50 mg daily if potassium remains stable. -SGLT2i: Can consider starting SGLT2i when insured. -Can consider adding hydralazine /isosorbide given resistant HTN.   Plan: 1) Medication changes recommended at this time: -Consider checking BMET and restarting furosemide  if creatinine is stable. -Consider starting hydralazine  25 mg TID and isosorbide mononitrate 30 mg daily  2) Patient assistance: -Patient does not have insurance currently as he has recently started new jobs.  -  May qualify for patient assistance for SGLT2i. Entresto no longer has patient assistance -Patient is able to use his wife's flex card for medications.  3) Education: - Patient has been educated on current HF medications  and potential additions to HF medication regimen - Patient verbalizes understanding that over the next few months, these medication doses may change and more medications may be added to optimize HF regimen - Patient has been educated on basic disease state pathophysiology and goals of therapy  Please do not hesitate to reach out with questions or concerns,  Dantrell Schertzer, PharmD, CPP, BCPS, [facility] Heart Failure Pharmacist  Phone - [phone] 03/06/2024 8:09 AM

## 2024-03-06 NOTE — Progress Notes (Signed)
°  Progress Note   Patient: Connor Blake FMW:[id number] DOB: 1975/10/04 DOA: 03/04/2024     2 DOS: the patient was seen and examined on 03/06/2024   Brief hospital course: Mr. Wayne Wicklund is a 48 year old male with history of morbid obesity, OSA on CPAP, medication noncompliance, who presents ED for chief concerns of shortness of breath and abdominal pain.  12/9: Patient admitted to [facility] hospitalist service for acute on chronic combined systolic and diastolic congestive heart failure.  Regarding abdominal pain, which was unclear etiology.  CT abdomen showed cholelithiasis with mild pericholecystic fluid/stranding, equivocal.  Patient was started empirically with Zosyn , as needed morphine , Percocet, acetaminophen  for pain.  12/10: I assumed care of the patient.  General surgery has been consulted.  Patient getting complete echo pending read at this time.  Continue Zosyn  IV.  Discussed with general surgery who recommends n.p.o. after midnight.  HIDA scan has been ordered.  Patient is a poor candidate for surgical intervention given morbid obesity and heart failure.  12/11: HIDA scan done did not show any signs of cholecystitis  Assessment and Plan:  Acute on chronic combined systolic and diastolic congestive heart failure (HCC) Continue strict I's and O's Complete echo ordered pending read at this time Lasix  40 mg IV twice daily Continue spironolactone  25 mg daily Next 1 to 2 days will transition Lasix  to p.o. and if he feels better he will be discharged home  Abdominal pain, suspected acute cholecystitis Suspected cholecystitis HIDA scan was done which was not indicating acute cholecystitis. Appreciate surgical team consult and recommendation. Patient will not need any surgical intervention per surgical team at this point.  Morbid obesity with BMI of 60.0-69.9, adult (HCC) This complicates overall care and prognosis.   OSA on CPAP CPAP nightly  HTN (hypertension) Carvedilol  25  mg p.o. twice daily, irbesartan  300 mg daily, spironolactone  25 mg daily Hydralazine  10 mg IV every 4 hours as needed for SBP greater than 165       Subjective: Denies any nausea vomiting abdomen chest pain shortness palpitation  Physical Exam: Vitals:   03/06/24 0800 03/06/24 0831 03/06/24 1208 03/06/24 1436  BP:  (!) 177/110 (!) 178/100 (!) 158/107  Pulse: 72 70 74 69  Resp:  20 20 18   Temp:  97.6 F (36.4 C) 97.7 F (36.5 C) 97.7 F (36.5 C)  TempSrc:  Oral Oral Oral  SpO2: 98% 100% 100% 100%  Weight:      Height:       Constitutional: Alert, awake, calm, comfortable HEENT: Neck supple Respiratory: Clear to auscultation B/L, no wheezing, no rales.  Cardiovascular: Regular rate and rhythm, no murmurs / rubs / gallops. No extremity edema. 2+ pedal pulses. No carotid bruits.  Abdomen: Soft, no tenderness, Bowel sounds positive.  Morbidly obese abdomen Musculoskeletal: no clubbing / cyanosis. Good ROM, no contractures. Normal muscle tone.  Skin: no rashes, lesions, ulcers. Neurologic: CN 2-12 grossly intact. Sensation intact, No focal deficit identified Psychiatric: Alert and oriented x 3. Normal mood.    Data Reviewed:  Reviewed HIDA scan is negative  Family Communication: Wife was at bedside  Disposition: Status is: Inpatient Remains inpatient appropriate because: Ongoing recovery from congestive heart failure and abdominal pain  Planned Discharge Destination: Home    Time spent: 35 minutes  Author: Nena Rebel, MD 03/06/2024 4:29 PM  For on call review [email or web address].

## 2024-03-06 NOTE — Progress Notes (Signed)
 Heart Failure Navigator Progress Note  Assessed for Heart & Vascular TOC clinic readiness.  Patient does not meet criteria due to current [facility] Cardiology patient.   Navigator will sign off at this time.  Roxy Horseman, RN, BSN [facility] Heart Failure Navigator Secure Chat Only

## 2024-03-07 LAB — COMPREHENSIVE METABOLIC PANEL WITH GFR
ALT: 13 U/L (ref 0–44)
AST: 20 U/L (ref 15–41)
Albumin: 3.5 g/dL (ref 3.5–5.0)
Alkaline Phosphatase: 194 U/L — ABNORMAL HIGH (ref 38–126)
Anion gap: 8 (ref 5–15)
BUN: 16 mg/dL (ref 6–20)
CO2: 29 mmol/L (ref 22–32)
Calcium: 9.1 mg/dL (ref 8.9–10.3)
Chloride: 100 mmol/L (ref 98–111)
Creatinine, Ser: 1.34 mg/dL — ABNORMAL HIGH (ref 0.61–1.24)
GFR, Estimated: >60 mL/min (ref >60)
Glucose, Bld: 119 mg/dL — ABNORMAL HIGH (ref 70–99)
Potassium: 4 mmol/L (ref 3.5–5.1)
Sodium: 138 mmol/L (ref 135–145)
Total Bilirubin: 2.4 mg/dL — ABNORMAL HIGH (ref 0.0–1.2)
Total Protein: 7.4 g/dL (ref 6.5–8.1)

## 2024-03-07 LAB — HEMOGLOBIN A1C
Hgb A1c MFr Bld: 5.2 % (ref 4.8–5.6)
Mean Plasma Glucose: 102.54 mg/dL

## 2024-03-07 NOTE — Plan of Care (Signed)
°  Problem: Education: Goal: Knowledge of General Education information will improve Description: Including pain rating scale, medication(s)/side effects and non-pharmacologic comfort measures 03/07/2024 1725 by Donavan Seaman, RN Outcome: Progressing 03/07/2024 1725 by Donavan Seaman, RN Outcome: Progressing   Problem: Health Behavior/Discharge Planning: Goal: Ability to manage health-related needs will improve 03/07/2024 1725 by Donavan Seaman, RN Outcome: Progressing 03/07/2024 1725 by Donavan Seaman, RN Outcome: Progressing   Problem: Clinical Measurements: Goal: Ability to maintain clinical measurements within normal limits will improve 03/07/2024 1725 by Donavan Seaman, RN Outcome: Progressing 03/07/2024 1725 by Donavan Seaman, RN Outcome: Progressing Goal: Will remain free from infection 03/07/2024 1725 by Donavan Seaman, RN Outcome: Progressing 03/07/2024 1725 by Donavan Seaman, RN Outcome: Progressing Goal: Diagnostic test results will improve 03/07/2024 1725 by Donavan Seaman, RN Outcome: Progressing 03/07/2024 1725 by Donavan Seaman, RN Outcome: Progressing Goal: Respiratory complications will improve 03/07/2024 1725 by Donavan Seaman, RN Outcome: Progressing 03/07/2024 1725 by Donavan Seaman, RN Outcome: Progressing Goal: Cardiovascular complication will be avoided 03/07/2024 1725 by Donavan Seaman, RN Outcome: Progressing 03/07/2024 1725 by Donavan Seaman, RN Outcome: Progressing   Problem: Activity: Goal: Risk for activity intolerance will decrease 03/07/2024 1725 by Donavan Seaman, RN Outcome: Progressing 03/07/2024 1725 by Donavan Seaman, RN Outcome: Progressing   Problem: Nutrition: Goal: Adequate nutrition will be maintained 03/07/2024 1725 by Donavan Seaman, RN Outcome: Progressing 03/07/2024 1725 by Donavan Seaman, RN Outcome: Progressing   Problem: Coping: Goal: Level of anxiety will decrease 03/07/2024  1725 by Donavan Seaman, RN Outcome: Progressing 03/07/2024 1725 by Donavan Seaman, RN Outcome: Progressing   Problem: Elimination: Goal: Will not experience complications related to bowel motility 03/07/2024 1725 by Donavan Seaman, RN Outcome: Progressing 03/07/2024 1725 by Donavan Seaman, RN Outcome: Progressing Goal: Will not experience complications related to urinary retention Outcome: Progressing   Problem: Pain Managment: Goal: General experience of comfort will improve and/or be controlled Outcome: Progressing   Problem: Safety: Goal: Ability to remain free from injury will improve Outcome: Progressing   Problem: Skin Integrity: Goal: Risk for impaired skin integrity will decrease Outcome: Progressing   Problem: Education: Goal: Ability to demonstrate management of disease process will improve Outcome: Progressing Goal: Ability to verbalize understanding of medication therapies will improve Outcome: Progressing Goal: Individualized Educational Video(s) Outcome: Progressing   Problem: Activity: Goal: Capacity to carry out activities will improve Outcome: Progressing   Problem: Cardiac: Goal: Ability to achieve and maintain adequate cardiopulmonary perfusion will improve Outcome: Progressing

## 2024-03-07 NOTE — Plan of Care (Signed)

## 2024-03-07 NOTE — Progress Notes (Signed)
°  Progress Note   Patient: Connor Blake FMW:[id number] DOB: [birth year] DOA: 03/04/2024     3 DOS: the patient was seen and examined on 03/07/2024   Brief hospital course: Mr. Colden Samaras is a 48 year old male with history of morbid obesity, OSA on CPAP, medication noncompliance, who presents ED for chief concerns of shortness of breath and abdominal pain.  12/9: Patient admitted to [facility]ist service for acute on chronic combined systolic and diastolic congestive heart failure.  Regarding abdominal pain, which was unclear etiology.  CT abdomen showed cholelithiasis with mild pericholecystic fluid/stranding, equivocal.  Patient was started empirically with Zosyn , as needed morphine , Percocet, acetaminophen  for pain.  12/10: I assumed care of the patient.  General surgery has been consulted.  Patient getting complete echo pending read at this time.  Continue Zosyn  IV.  Discussed with general surgery who recommends n.p.o. after midnight.  HIDA scan has been ordered.  Patient is a poor candidate for surgical intervention given morbid obesity and heart failure.  12/11: HIDA scan done did not show any signs of cholecystitis 12/12: Continue IV diuretics, optimize congestive heart failure medication, cardiology consult requested.  Assessment and Plan:  Acute on chronic combined systolic and diastolic congestive heart failure (HCC) Continue strict I's and O's Complete echo ordered pending read at this time Lasix  40 mg IV twice daily Continue spironolactone  25 mg daily Next 1 to 2 days will transition Lasix  to p.o. and if he feels better he will be discharged home Cardiology will be consulted to optimize medication   Abdominal pain, suspected acute cholecystitis Suspected cholecystitis HIDA scan was done which was not indicating acute cholecystitis. Appreciate surgical team consult and recommendation. Patient will not need any surgical intervention per surgical team at this point.    Morbid obesity with BMI of 60.0-69.9, adult (HCC) This complicates overall care and prognosis.    OSA on CPAP CPAP nightly   HTN (hypertension) Carvedilol  25 mg p.o. twice daily, irbesartan  300 mg daily, spironolactone  25 mg daily Hydralazine  10 mg IV every 4 hours as needed for SBP greater than 165           Subjective: Feels better denies any chest pain nausea vomiting  Physical Exam: Vitals:   03/07/24 0356 03/07/24 0805 03/07/24 1212 03/07/24 1604  BP:  (!) 135/93 (!) 144/99 120/82  Pulse: 73 70 69 68  Resp: (!) 21 18 18 18   Temp:  97.7 F (36.5 C) 97.9 F (36.6 C) 97.8 F (36.6 C)  TempSrc:      SpO2: 93% 98% 98% 99%  Weight:      Height:       Constitutional: Alert, awake, calm, comfortable HEENT: Neck supple Respiratory: Clear to auscultation B/L, no wheezing, no rales.  Cardiovascular: Regular rate and rhythm, no murmurs / rubs / gallops. No extremity edema. 2+ pedal pulses. No carotid bruits.  Abdomen: Soft, no tenderness, Bowel sounds positive.  Morbidly obese abdomen Musculoskeletal: no clubbing / cyanosis. Good ROM, no contractures. Normal muscle tone.  Skin: no rashes, lesions, ulcers. Neurologic: CN 2-12 grossly intact. Sensation intact, No focal deficit identified Psychiatric: Alert and oriented x 3. Normal mood.    Data Reviewed:  Reviewed  Family Communication: Wife was at bedside  Disposition: Status is: Inpatient Remains inpatient appropriate because: Ongoing recovery from congestive heart failure  Planned Discharge Destination: Home    Time spent: 35 minutes  Author: Nena Rebel, MD 03/07/2024 4:36 PM  For on call review [email or web address].

## 2024-03-07 NOTE — TOC CM/SW Note (Signed)
 Transition of Care [facility]) - Inpatient Brief Assessment   Patient Details  Name: WYMON SWANEY MRN: [id number] Date of Birth: April 08, 1975  Transition of [facility]) CM/SW Contact:    Lauraine JAYSON Carpen, LCSW Phone Number: 03/07/2024, 10:09 AM   Clinical Narrative: CSW reviewed chart. No insurance. Sent secure chat to pharmacist to notify. No other TOC needs identified so far. CSW will continue to follow progress. Please place [facility] consult if any needs arise.  Transition of Care Asessment: Insurance and Status: Selfpay Patient has primary care physician: Yes Home environment has been reviewed: Single family home Prior level of function:: Not documented Prior/Current Home Services: No current home services Social Drivers of Health Review: SDOH reviewed no interventions necessary Readmission risk has been reviewed: Yes Transition of care needs: no transition of care needs at this time

## 2024-03-07 NOTE — Progress Notes (Signed)
 Heart Failure Stewardship Pharmacy Note  PCP: Cletus Glenn PCP-Cardiologist: None  HPI: Connor Blake is a 48 y.o. male with CHF, HTN, obesity, OSA who presented with shortness of breath, fatigue, orthopnea, abdominal distention, and LEE progressing over the past few months. On admission, proBNP was 639, HS-troponin was 36, alk phos 219, Tbili 2.4, and lipase 29. Chest x-ray noted mild central venous congestion. CT AP noted cholelithiasis with mild pericholecystic fluid/stranding, raising the possibility of acute cholecystitis, so US  of the abdomen was ordered.   Pertinent cardiac history: TTE 05/2023 with LVEF of <20%, G1DD. LHC 06/2023 showed no obstructive CAD. TTE 11/2023 after initiation of GDMT with LVEF improved to 40%, severe LVH, G2DD, trivial MR, trivial PR, mild TR.  Pertinent Lab Values: Creatinine, Ser  Date Value Ref Range Status  03/07/2024 1.34 (H) 0.61 - 1.24 mg/dL Final   BUN  Date Value Ref Range Status  03/07/2024 16 6 - 20 mg/dL Final   Potassium  Date Value Ref Range Status  03/07/2024 4.0 3.5 - 5.1 mmol/L Final   Sodium  Date Value Ref Range Status  03/07/2024 138 135 - 145 mmol/L Final   Magnesium  Date Value Ref Range Status  03/05/2024 1.9 1.7 - 2.4 mg/dL Final    Comment:    Performed at [facility], [address] [place], [place] [address]    Vital Signs:  Temp:  [97.6 F (36.4 C)-98 F (36.7 C)] 97.8 F (36.6 C) (12/12 0315) Pulse Rate:  [63-74] 73 (12/12 0356) Cardiac Rhythm: Normal sinus rhythm (12/12 0318) Resp:  [16-21] 21 (12/12 0356) BP: (116-178)/(74-110) 139/98 (12/12 0315) SpO2:  [93 %-100 %] 93 % (12/12 0356) FiO2 (%):  [21 %] 21 % (12/12 0356) Weight:  [214.9 kg (473 lb 12.3 oz)] 214.9 kg (473 lb 12.3 oz) (12/12 0315)  Intake/Output Summary (Last 24 hours) at 03/07/2024 0749 Last data filed at 03/07/2024 9352 Gross per 24 hour  Intake --  Output 4200 ml  Net -4200 ml   Current Heart Failure  Medications:  Loop diuretic: furosemide  40 mg IV BID Beta-Blocker: carvedilol  25 mg BID ACEI/ARB/ARNI: irbesartan  300 mg daily MRA: spironolactone  25 mg daily SGLT2i: none Other: none  Prior to admission Heart Failure Medications:  Loop diuretic: furosemide  40 mg BID (not taking) Beta-Blocker: carvedilol  25 mg BID ACEI/ARB/ARNI: telmisartan 80 mg daily MRA: spironolactone  25 mg daily (not taking) SGLT2i: none Other: none  Assessment: 1. Acute on chronic combined systolic and diastolic heart failure (LVEF 40%) with G2DD, due to NICM. NYHA class III symptoms.  -Symptoms: Reports shortness of breath and fatigue are continuing to improve with diuresis. Reports appetite has improved, reduced persistent abdominal swelling, and LEE. -Volume: Still appears hypervolemic on exam. Great urine output on furosemide  40 mg IV BID. Suspect small creatinine bump could be from contrast. Urine color is still clear and somewhat light, would continue IV diuresis today and consider oral diuretics tomorrow. -Hemodynamics: BP severely elevated on admission, remains high this AM. HR 70s. -BB: Continue carvedilol  25 mg BID at this time. Can be increased further when euvolemic. -ACEI/ARB/ARNI: Continue irbesartan  300 mg daily. Would benefit from transition to Entresto 49-51 mg BID, though patient has no insurance currently.  -MRA: Continue spironolactone  25 mg daily. Can consider increasing to 50 mg daily. -SGLT2i: Can consider starting SGLT2i when insured. -Can consider adding hydralazine /isosorbide today given resistant HTN.   Plan: 1) Medication changes recommended at this time: -Consider starting hydralazine  25 mg TID and isosorbide mononitrate 30  mg daily  2) Patient assistance: -Patient does not have insurance currently as he has recently started new jobs.  -May qualify for patient assistance for SGLT2i. Entresto no longer has patient assistance -Patient is able to use his wife's flex card for  medications.  3) Education: - Patient has been educated on current HF medications and potential additions to HF medication regimen - Patient verbalizes understanding that over the next few months, these medication doses may change and more medications may be added to optimize HF regimen - Patient has been educated on basic disease state pathophysiology and goals of therapy  Please do not hesitate to reach out with questions or concerns,  Eilah Common, PharmD, CPP, BCPS, [facility] Heart Failure Pharmacist  Phone - [phone] 03/07/2024 7:49 AM

## 2024-03-08 MED ORDER — FUROSEMIDE 40 MG PO TABS
40.0000 mg | ORAL_TABLET | Freq: Two times a day (BID) | ORAL | Status: DC
  Administered 2024-03-08 – 2024-03-09 (×2): 40 mg via ORAL
  Filled 2024-03-08 (×2): qty 1

## 2024-03-08 NOTE — Consult Note (Signed)
 [facility] Cardiology  CARDIOLOGY CONSULT NOTE  Patient ID: Connor Blake MRN: [id number] DOB/AGE: [birth year] 46 y.o.  Admit date: 03/04/2024 Referring Physician Paudel Primary Physician [facility] Primary Cardiologist Alluri Reason for Consultation congestive heart failure  HPI: The patient is a 48 year old gentleman referred for evaluation of congestive heart failure.  Patient admitted on 03/04/2024 with lewd overload and shortness of breath.  Patient also complained of abdominal pain, with CT scan revealing cholelithiasis.  HIDA scan unremarkable, without evidence for acute cholecystitis.  Patient has a history of reduced left ventricular function, with 2D echocardiogram 06/04/2023 revealing LVEF less than 20%.  Cardiac catheterization 06/28/2023 not reveal evidence for significant coronary artery disease.  Patient was treated with good medical management.  Follow-up 2D echocardiogram 12/21/2023 revealed LVEF 40%.  The patient has lost his job, stopped taking his medications, and presents with acute on chronic HFimpEF.  2D echocardiogram 03/05/2024 revealed LVEF 40-45%.  Patient has been treated with furosemide  40 mg twice daily with excellent diuresis.  Good medical management was reinstated, patient currently on carvedilol , hydralazine , irbesartan , and spironolactone .  Review of systems complete and found to be negative unless listed above     Past Medical History:  Diagnosis Date   Chronic combined systolic and diastolic congestive heart failure (HCC)    HTN (hypertension)    Medical history non-contributory    Morbid obesity with BMI of 60.0-69.9, adult [facility])     Past Surgical History:  Procedure Laterality Date   LEFT HEART CATH AND CORONARY ANGIOGRAPHY Left 06/28/2023   Procedure: LEFT HEART CATH AND CORONARY ANGIOGRAPHY;  Surgeon: Florencio Cara BIRCH, MD;  Location: [facility] INVASIVE CV LAB;  Service: Cardiovascular;  Laterality: Left;   NO PAST SURGERIES      Medications Prior to Admission   Medication Sig Dispense Refill Last Dose/Taking   carvedilol  (COREG ) 25 MG tablet Take 25 mg by mouth 2 (two) times daily.   03/03/2024 Morning   furosemide  (LASIX ) 40 MG tablet Take 1 tablet by mouth 2 (two) times daily.   03/02/2024   meloxicam (MOBIC) 15 MG tablet Take 15 mg by mouth daily as needed for pain.   Past Week   spironolactone  (ALDACTONE ) 25 MG tablet Take 1 tablet by mouth daily.   03/03/2024   telmisartan  (MICARDIS ) 80 MG tablet Take 80 mg by mouth daily.   03/03/2024   losartan (COZAAR) 50 MG tablet Take 1 tablet by mouth daily. (Patient not taking: Reported on 03/04/2024)   Not Taking   [EXPIRED] ondansetron  (ZOFRAN -ODT) 4 MG disintegrating tablet Take 4 mg by mouth.  Take 1 tablet (4 mg total) by mouth every 8 (eight) hours as needed for Nausea for up to 7 days (Patient not taking: Reported on 03/04/2024)   Not Taking   prednisoLONE acetate (PRED FORTE) 1 % ophthalmic suspension Place 1 drop into the left eye daily. (Patient not taking: Reported on 07/04/2023)      Social History   Socioeconomic History   Marital status: Married    Spouse name: Not on file   Number of children: Not on file   Years of education: Not on file   Highest education level: Not on file  Occupational History   Not on file  Tobacco Use   Smoking status: Never   Smokeless tobacco: Never  Vaping Use   Vaping status: Never Used  Substance and Sexual Activity   Alcohol use: Never   Drug use: Never   Sexual activity: Never  Other Topics Concern  Not on file  Social History Narrative   Not on file   Social Drivers of Health   Tobacco Use: Low Risk (03/04/2024)   Patient History    Smoking Tobacco Use: Never    Smokeless Tobacco Use: Never    Passive Exposure: Not on file  Financial Resource Strain: Patient Declined (06/01/2023)   Received from [facility]   Overall Financial Resource Strain (CARDIA)    Difficulty of Paying Living Expenses: Patient declined  Food Insecurity:  No Food Insecurity (03/07/2024)   Epic    Worried About Running Out of Food in the Last Year: Never true    Ran Out of Food in the Last Year: Never true  Transportation Needs: No Transportation Needs (03/07/2024)   Epic    Lack of Transportation (Medical): No    Lack of Transportation (Non-Medical): No  Physical Activity: Not on file  Stress: Not on file  Social Connections: Not on file  Intimate Partner Violence: Not At Risk (03/07/2024)   Epic    Fear of Current or Ex-Partner: No    Emotionally Abused: No    Physically Abused: No    Sexually Abused: No  Depression (PHQ2-9): Medium Risk (08/27/2023)   Depression (PHQ2-9)    PHQ-2 Score: 5  Alcohol Screen: Not on file  Housing: Low Risk (03/07/2024)   Epic    Unable to Pay for Housing in the Last Year: No    Number of Times Moved in the Last Year: 0    Homeless in the Last Year: No  Utilities: Not At Risk (03/07/2024)   Epic    Threatened with loss of utilities: No  Health Literacy: Not on file    Family History  Problem Relation Age of Onset   Hypertension Mother    Heart failure Father       Review of systems complete and found to be negative unless listed above      PHYSICAL EXAM  General: Well developed, well nourished, in no acute distress HEENT:  Normocephalic and atramatic Neck:  No JVD.  Lungs: Clear bilaterally to auscultation and percussion. Heart: HRRR . Normal S1 and S2 without gallops or murmurs.  Abdomen: Bowel sounds are positive, abdomen soft and non-tender  Msk:  Back normal, normal gait. Normal strength and tone for age. Extremities: No clubbing, cyanosis or edema.   Neuro: Alert and oriented X 3. Psych:  Good affect, responds appropriately  Labs:   Lab Results  Component Value Date   WBC 4.5 03/06/2024   HGB 12.9 (L) 03/06/2024   HCT 40.3 03/06/2024   MCV 90.8 03/06/2024   PLT 172 03/06/2024    Recent Labs  Lab 03/07/24 0413  NA 138  K 4.0  CL 100  CO2 29  BUN 16  CREATININE  1.34*  CALCIUM 9.1  PROT 7.4  BILITOT 2.4*  ALKPHOS 194*  ALT 13  AST 20  GLUCOSE 119*   No results found for: CKTOTAL, CKMB, CKMBINDEX, TROPONINI No results found for: CHOL No results found for: HDL No results found for: LDLCALC No results found for: TRIG No results found for: CHOLHDL No results found for: LDLDIRECT    Radiology: NM Hepatobiliary Liver Func Result Date: 03/06/2024 EXAM: NM HEPATOBILLARY SCAN 03/06/2024 02:54:04 PM TECHNIQUE: RADIOPHARMACEUTICAL: 7.78 mCi Tc-88m mebrofenin  Dynamic images of the abdomen and pelvis were obtained in the anterior projection for 1 hour after intravenous administration of radiopharmaceutical. 3 mg of morphine  was administered intravenously and imaging continued for at  least 30 minutes. COMPARISON: None available. CLINICAL HISTORY: Cholelithiasis, rule out cholecystitis. FINDINGS: Homogenous uptake within the liver. Normal clearance of the blood pool. Appropriate excretion into the biliary system. At 60 minutes, counts are evident in the small bowel. No gallbladder visualization at 60 minutes. 3 mg of morphine  was administered to augment filling of the gallbladder. The gallbladder fills after morphine  augmentation. IMPRESSION: 1. No acute cholecystitis. Gallbladder fills after morphine  augmentation. Electronically signed by: Norleen Boxer MD 03/06/2024 03:45 PM EST RP Workstation: [id number]   ECHOCARDIOGRAM COMPLETE Result Date: 03/05/2024    ECHOCARDIOGRAM REPORT   Patient Name:   Connor Blake Date of Exam: 03/05/2024 Medical Rec #:  [id number]        Height:       73.0 in Accession #:    [id number]       Weight:       515.0 lb Date of Birth:  06-03-1975        BSA:          3.216 m Patient Age:    48 years         BP:           128/79 mmHg Patient Gender: M                HR:           73 bpm. Exam Location:  [facility] Procedure: 2D Echo, Cardiac Doppler and Color Doppler (Both Spectral and Color            Flow Doppler were  utilized during procedure). Indications:     CHF-acute systolic I50.21  History:         Patient has prior history of Echocardiogram examinations, most                  recent 06/05/2023. Risk Factors:Hypertension. Chronic combined                  diastolic and systolic Congestive heart failure.  Sonographer:     Christopher Furnace Referring Phys:  [id number] XILIN NIU Diagnosing Phys: Marsa Dooms MD  Sonographer Comments: Technically challenging study due to limited acoustic windows, no apical window and no subcostal window. Image acquisition challenging due to patient body habitus. IMPRESSIONS  1. Left ventricular ejection fraction, by estimation, is 40 to 45%. The left ventricle has mildly decreased function. The left ventricle has no regional wall motion abnormalities. Left ventricular diastolic parameters are indeterminate.  2. Right ventricular systolic function is normal. The right ventricular size is normal.  3. The mitral valve is normal in structure. Trivial mitral valve regurgitation. No evidence of mitral stenosis.  4. The aortic valve is normal in structure. Aortic valve regurgitation is not visualized. No aortic stenosis is present.  5. The inferior vena cava is normal in size with greater than 50% respiratory variability, suggesting right atrial pressure of 3 mmHg. FINDINGS  Left Ventricle: Left ventricular ejection fraction, by estimation, is 40 to 45%. The left ventricle has mildly decreased function. The left ventricle has no regional wall motion abnormalities. Strain was performed and the global longitudinal strain is indeterminate. The left ventricular internal cavity size was normal in size. There is no left ventricular hypertrophy. Left ventricular diastolic parameters are indeterminate. Right Ventricle: The right ventricular size is normal. No increase in right ventricular wall thickness. Right ventricular systolic function is normal. Left Atrium: Left atrial size was normal in size. Right Atrium:  Right atrial size was normal in size.  Pericardium: There is no evidence of pericardial effusion. Mitral Valve: The mitral valve is normal in structure. Trivial mitral valve regurgitation. No evidence of mitral valve stenosis. Tricuspid Valve: The tricuspid valve is normal in structure. Tricuspid valve regurgitation is trivial. No evidence of tricuspid stenosis. Aortic Valve: The aortic valve is normal in structure. Aortic valve regurgitation is not visualized. No aortic stenosis is present. Pulmonic Valve: The pulmonic valve was normal in structure. Pulmonic valve regurgitation is not visualized. No evidence of pulmonic stenosis. Aorta: The aortic root is normal in size and structure. Venous: The inferior vena cava is normal in size with greater than 50% respiratory variability, suggesting right atrial pressure of 3 mmHg. IAS/Shunts: No atrial level shunt detected by color flow Doppler. Additional Comments: 3D was performed not requiring image post processing on an independent workstation and was indeterminate.  LEFT VENTRICLE PLAX 2D LVIDd:         5.20 cm LVIDs:         4.05 cm LV PW:         0.90 cm LV IVS:        1.60 cm LVOT diam:     3.55 cm LVOT Area:     9.90 cm  LEFT ATRIUM         Index LA diam:    4.60 cm 1.43 cm/m   AORTA Ao Root diam: 4.30 cm TRICUSPID VALVE TR Peak grad:   20.2 mmHg TR Vmax:        225.00 cm/s  SHUNTS Systemic Diam: 3.55 cm Marsa Dooms MD Electronically signed by Marsa Dooms MD Signature Date/Time: 03/05/2024/1:50:40 PM    Final    US  Abdomen Limited RUQ (LIVER/GB) Result Date: 03/05/2024 CLINICAL DATA:  2 day history of abdominal pain. EXAM: ULTRASOUND ABDOMEN LIMITED RIGHT UPPER QUADRANT COMPARISON:  CT scan from 1 day prior. FINDINGS: Gallbladder: Gallbladder demonstrates an apparent wall echo shadow sign suggesting left the wall is contracted down against intraluminal stones. Gallbladder wall thickness is mildly increased at 3.1 mm. Sonographer reports no  sonographic Murphy sign. Common bile duct: Diameter: 4 mm Liver: Increased echogenicity without focal abnormality. Portal vein is patent on color Doppler imaging with normal direction of blood flow towards the liver. Other: None. IMPRESSION: 1. Gallbladder demonstrates an apparent wall echo shadow sign suggesting the wall is contracted down against intraluminal stones. Gallbladder wall thickness is mildly increased at 3.1 mm. Close correlation for signs/symptoms of acute cholecystitis recommended. 2. No biliary dilatation. 3. Increased echogenicity of the liver parenchyma is a nonspecific finding but can be seen in the setting of hepatic steatosis. Electronically Signed   By: Camellia Candle M.D.   On: 03/05/2024 08:46   CT ABDOMEN PELVIS W CONTRAST Result Date: 03/04/2024 EXAM: CT ABDOMEN AND PELVIS WITH CONTRAST 03/04/2024 11:08:52 PM TECHNIQUE: CT of the abdomen and pelvis was performed with the administration of 125 mL of iohexol  (OMNIPAQUE ) 350 MG/ML injection. Multiplanar reformatted images are provided for review. Automated exposure control, iterative reconstruction, and/or weight-based adjustment of the mA/kV was utilized to reduce the radiation dose to as low as reasonably achievable. COMPARISON: None available. CLINICAL HISTORY: Abdominal pain, acute, nonlocalized. FINDINGS: LOWER CHEST: No acute abnormality. LIVER: Hepatic steatosis. GALLBLADDER AND BILE DUCTS: Cholelithiasis with mild pericholecystic fluid/stranding (image 37), equivocal but at least raising the possibility of acute cholecystitis. No biliary ductal dilatation. SPLEEN: No acute abnormality. PANCREAS: No acute abnormality. ADRENAL GLANDS: No acute abnormality. KIDNEYS, URETERS AND BLADDER: No stones in the kidneys or ureters. No  hydronephrosis. No perinephric or periureteral stranding. Urinary bladder is unremarkable. GI AND BOWEL: Stomach demonstrates no acute abnormality. Small periumbilical hernia containing a knuckle of nondilated  small bowel (sagittal image 70). There is no bowel obstruction. PERITONEUM AND RETROPERITONEUM: Trace free fluid within the pelvis. No free air. VASCULATURE: Aorta is normal in caliber. LYMPH NODES: Small periaortic nodes, within normal limits. REPRODUCTIVE ORGANS: Prostate is unremarkable. BONES AND SOFT TISSUES: No acute osseous abnormality. Mild body wall edema. Normal appendix (image 73). IMPRESSION: 1. Cholelithiasis with mild pericholecystic fluid/stranding, equivocal but raising the possibility of acute cholecystitis. Consider RUQ ultrasound for further evaluation. 2. Small periumbilical hernia containing a knuckle of nondilated small bowel. No evidence of bowel obstruction. Electronically signed by: Pinkie Pebbles MD 03/04/2024 11:18 PM EST RP Workstation: [id number]   DG Chest 2 View Result Date: 03/04/2024 EXAM: 2 VIEW(S) XRAY OF THE CHEST 03/04/2024 02:48:33 PM COMPARISON: None available. CLINICAL HISTORY: cp FINDINGS: LUNGS AND PLEURA: No focal pulmonary opacity. No pleural effusion. No pneumothorax. HEART AND MEDIASTINUM: Cardiomegaly. Mild central venous congestion. BONES AND SOFT TISSUES: Multilevel degenerative changes of thoracic spine. IMPRESSION: 1. Cardiomegaly with mild central venous congestion. Electronically signed by: Norleen Boxer MD 03/04/2024 03:44 PM EST RP Workstation: [id number]    EKG: Normal sinus rhythm at 89 bpm with nonspecific ST-T abnormalities  ASSESSMENT AND PLAN:   1.  Acute on chronic combined systolic and diastolic congestive heart failure, exacerbated by dietary and medical noncompliance, improved after diuresis with IV furosemide  2.  Abdominal pain, suspected acute cholecystitis, HIDA scan negative, no surgical intervention needed 3.  Morbid obesity 4.  Essential hypertension  Recommendations  1.  Agree with current therapy 2.  Transition IV furosemide  to p.o. furosemide  40 mg twice daily 3.  Continue good medical management (carvedilol , irbesartan ,  hydralazine , spironolactone , and furosemide ) 4.  Stressed the importance of no added salt, low-sodium diet with patient and patient's wife 5.  Stressed the importance of medical compliance 6.  Probable discharge home in a.m. 7.  Follow-up with me in 1 week 8.  Follow-up with [facility] outpatient    Signed: Marsa Dooms MD,PhD, FACC 03/08/2024, 10:15 AM

## 2024-03-08 NOTE — Plan of Care (Signed)

## 2024-03-08 NOTE — Progress Notes (Signed)
 Addressed patient had his last BM on 03/03/2024, at the same time asked if patient wanted stool softener or if he uses anything at home for [facility], patient refused for stool softener.

## 2024-03-08 NOTE — Progress Notes (Addendum)
 Progress Note   Patient: Connor Blake FMW:[id number] DOB: [birth year] DOA: 03/04/2024     4 DOS: the patient was seen and examined on 03/08/2024   Brief hospital course: [name] is a 48 year old male with history of morbid obesity, OSA on CPAP, medication noncompliance, who presents ED for chief concerns of shortness of breath and abdominal pain.  12/9: Patient admitted to [facility] hospitalist service for acute on chronic combined systolic and diastolic congestive heart failure.  Regarding abdominal pain, which was unclear etiology.  CT abdomen showed cholelithiasis with mild pericholecystic fluid/stranding, equivocal.  Patient was started empirically with Zosyn , as needed morphine , Percocet, acetaminophen  for pain.  12/10: I assumed care of the patient.  General surgery has been consulted.  Patient getting complete echo pending read at this time.  Continue Zosyn  IV.  Discussed with general surgery who recommends n.p.o. after midnight.  HIDA scan has been ordered.  Patient is a poor candidate for surgical intervention given morbid obesity and heart failure.  12/11: HIDA scan done did not show any signs of cholecystitis 12/12: Continue IV diuretics, optimize congestive heart failure medication, cardiology consult requested.  Assessment and Plan:  Acute on chronic combined systolic and diastolic congestive heart failure (HCC) Continue strict I's and O's Complete echo ordered pending read at this time Lasix  40 mg IV twice daily has been changed to p.o. by cardiology Continue spironolactone  25 mg daily Next 1 to 2 days will transition Lasix  to p.o. and if he feels better he will be discharged home Cardiology input appreciated, follow-up with cardiology in 1 week after discharge   Abdominal pain, suspected acute cholecystitis Suspected cholecystitis HIDA scan was done which was not indicating acute cholecystitis. Appreciate surgical team consult and recommendation. Patient will not  need any surgical intervention per surgical team at this point.   Morbid obesity with BMI of 60.0-69.9, adult (HCC) This complicates overall care and prognosis.    OSA on CPAP CPAP nightly   HTN (hypertension) urgency, POA Carvedilol  25 mg p.o. twice daily, irbesartan  300 mg daily, spironolactone  25 mg daily Hydralazine  10 mg IV every 4 hours as needed for SBP greater than 165      Subjective: Feels better, no shortness of breath no chest pain  Physical Exam: Vitals:   03/07/24 2350 03/08/24 0500 03/08/24 0938 03/08/24 1333  BP: 131/85  139/78 112/74  Pulse: 70  77 70  Resp: 20  18 18   Temp: 99 F (37.2 C)  97.6 F (36.4 C) 97.8 F (36.6 C)  TempSrc:      SpO2: 96%  98% 96%  Weight:  (!) 211.5 kg    Height:       Constitutional: Alert, awake, calm, comfortable HEENT: Neck supple Respiratory: Clear to auscultation B/L, no wheezing, no rales.  Cardiovascular: Regular rate and rhythm, no murmurs / rubs / gallops. No extremity edema. 2+ pedal pulses. No carotid bruits.  Abdomen: Soft, no tenderness, Bowel sounds positive.  Musculoskeletal: no clubbing / cyanosis. Good ROM, no contractures. Normal muscle tone.  Skin: no rashes, lesions, ulcers. Neurologic: CN 2-12 grossly intact. Sensation intact, No focal deficit identified Psychiatric: Alert and oriented x 3. Normal mood.    Data Reviewed:  Reviewed  Family Communication: Wife at bedside along with patient's daughter  Disposition: Status is: Inpatient Remains inpatient appropriate because: Ongoing recovery from congestive heart failure management medication and diuresis  Planned Discharge Destination: Home    Time spent: 35 minutes  Author: Nena Rebel, MD 03/08/2024 3:38  PM  For on call review [email or web address].

## 2024-03-09 ENCOUNTER — Other Ambulatory Visit: Payer: Self-pay

## 2024-03-09 LAB — CBC
HCT: 44.8 % (ref 39.0–52.0)
Hemoglobin: 14 g/dL (ref 13.0–17.0)
MCH: 29 pg (ref 26.0–34.0)
MCHC: 31.3 g/dL (ref 30.0–36.0)
MCV: 92.8 fL (ref 80.0–100.0)
Platelets: 209 K/uL (ref 150–400)
RBC: 4.83 MIL/uL (ref 4.22–5.81)
RDW: 14.7 % (ref 11.5–15.5)
WBC: 4.6 K/uL (ref 4.0–10.5)
nRBC: 0 % (ref 0.0–0.2)

## 2024-03-09 LAB — BASIC METABOLIC PANEL WITH GFR
Anion gap: 10 (ref 5–15)
BUN: 18 mg/dL (ref 6–20)
CO2: 30 mmol/L (ref 22–32)
Calcium: 9.1 mg/dL (ref 8.9–10.3)
Chloride: 98 mmol/L (ref 98–111)
Creatinine, Ser: 1.21 mg/dL (ref 0.61–1.24)
GFR, Estimated: >60 mL/min (ref >60)
Glucose, Bld: 88 mg/dL (ref 70–99)
Potassium: 4.1 mmol/L (ref 3.5–5.1)
Sodium: 137 mmol/L (ref 135–145)

## 2024-03-09 LAB — MAGNESIUM: Magnesium: 2 mg/dL (ref 1.7–2.4)

## 2024-03-09 MED ORDER — FUROSEMIDE 40 MG PO TABS
40.0000 mg | ORAL_TABLET | Freq: Two times a day (BID) | ORAL | 0 refills | Status: AC
  Filled 2024-03-09: qty 30, 15d supply, fill #0
  Filled 2024-03-09: qty 60, 30d supply, fill #0

## 2024-03-09 MED ORDER — TELMISARTAN 80 MG PO TABS
80.0000 mg | ORAL_TABLET | Freq: Every day | ORAL | 0 refills | Status: AC
  Filled 2024-03-09: qty 30, 30d supply, fill #0

## 2024-03-09 MED ORDER — SPIRONOLACTONE 25 MG PO TABS
25.0000 mg | ORAL_TABLET | Freq: Every day | ORAL | 0 refills | Status: AC
  Filled 2024-03-09: qty 30, 30d supply, fill #0

## 2024-03-09 MED ORDER — CARVEDILOL 25 MG PO TABS
25.0000 mg | ORAL_TABLET | Freq: Two times a day (BID) | ORAL | 0 refills | Status: AC
  Filled 2024-03-09: qty 60, 30d supply, fill #0

## 2024-03-09 NOTE — Discharge Summary (Signed)
 Physician Discharge Summary   Patient: Connor Blake MRN: [id number] DOB: 05-06-1975  Admit date:     03/04/2024  Discharge date: 03/09/2024  Discharge Physician: Nena Rebel   PCP: [facility]   Recommendations at discharge:   Continue taking your Lasix  40 mg twice daily, spironolactone  and other medications as prescribed Follow-up with cardiologist within 1 week  Follow-up with primary care provider within 2 weeks  Discharge Diagnoses: Principal Problem:   Acute on chronic combined systolic and diastolic congestive heart failure (HCC) Active Problems:   Morbid obesity with BMI of 60.0-69.9, adult (HCC)   Abdominal pain   OSA on CPAP   HTN (hypertension)   Calculus of gallbladder without cholecystitis without obstruction  Resolved Problems:   * No resolved hospital problems. [facility] Course: Mr. Gregrey Bloyd is a 48 year old male with history of morbid obesity, OSA on CPAP, medication noncompliance, who presents ED for chief concerns of shortness of breath and abdominal pain.  12/9: Patient admitted to [facility] for acute on chronic combined systolic and diastolic congestive heart failure.  Regarding abdominal pain, which was unclear etiology.  CT abdomen showed cholelithiasis with mild pericholecystic fluid/stranding, equivocal.  Patient was started empirically with Zosyn , as needed morphine , Percocet, acetaminophen  for pain.  12/10: I assumed care of the patient.  General surgery has been consulted.  Patient getting complete echo pending read at this time.  Continue Zosyn  IV.  Discussed with general surgery who recommends n.p.o. after midnight.  HIDA scan has been ordered.  Patient is a poor candidate for surgical intervention given morbid obesity and heart failure.  12/11: HIDA scan done did not show any signs of cholecystitis 12/12: Continue IV diuretics, optimize congestive heart failure medication, cardiology consult requested. 12/13:  Diuretics have been changed to oral 40 mg twice daily Lasix .  Patient is asymptomatic.  He had a mild AKI.  Assessment and Plan:  Acute on chronic combined systolic and diastolic congestive heart failure (HCC) 2D echo was done which showed ejection fraction 40 to 45%. Lasix  40 mg IV twice daily has been changed to p.o. by cardiology, 12/13 Continue spironolactone  25 mg daily Patient has been transition to oral diuretics, AKI has resolved, patient is asymptomatic, patient will be discharged home and follow-up with cardiologist within 1 week as outpatient.  Abdominal pain, suspected acute cholecystitis, ruled out, resolved Suspected cholecystitis HIDA scan was done which was not indicating acute cholecystitis. Appreciate surgical team consult and recommendation. Patient will not need any surgical intervention per surgical team at this point.   Morbid obesity with BMI of 60.0-69.9, adult (HCC) This complicates overall care and prognosis. Extensive counseling was done with the patient and patient's family, wife and daughter at bedside.  They will work on weight reduction strategies.   OSA on CPAP Continue home CPAP nightly   HTN (hypertension) urgency, POA Carvedilol  25 mg p.o. twice daily, irbesartan  300 mg daily, spironolactone  25 mg daily Stable.  Patient to follow-up with primary care provider to optimize medications      Consultants: Cardiology Procedures performed: Echocardiogram Disposition: Home Diet recommendation:  Cardiac diet DISCHARGE MEDICATION: Allergies as of 03/09/2024   No Known Allergies      Medication List     STOP taking these medications    losartan 50 MG tablet Commonly known as: COZAAR   ondansetron  4 MG disintegrating tablet Commonly known as: ZOFRAN -ODT       TAKE these medications    carvedilol  25 MG tablet Commonly known  as: COREG  Take 1 tablet (25 mg total) by mouth 2 (two) times daily.   furosemide  40 MG tablet Commonly known  as: LASIX  Take 1 tablet (40 mg total) by mouth 2 (two) times daily.   meloxicam 15 MG tablet Commonly known as: MOBIC Take 15 mg by mouth daily as needed for pain.   prednisoLONE acetate 1 % ophthalmic suspension Commonly known as: PRED FORTE Place 1 drop into the left eye daily.   spironolactone  25 MG tablet Commonly known as: ALDACTONE  Take 1 tablet (25 mg total) by mouth daily.   telmisartan  80 MG tablet Commonly known as: MICARDIS  Take 1 tablet (80 mg total) by mouth daily.        Discharge Exam: Filed Weights   03/07/24 0315 03/08/24 0500 03/09/24 0500  Weight: (!) 214.9 kg (!) 211.5 kg (!) 211.5 kg   Constitutional: Alert, awake, calm, comfortable HEENT: Neck supple Respiratory: Clear to auscultation B/L, no wheezing, no rales.  Cardiovascular: Regular rate and rhythm, no murmurs / rubs / gallops. No extremity edema. 2+ pedal pulses. No carotid bruits.  Abdomen: Soft, no tenderness, Bowel sounds positive.  Morbidly obese abdomen Musculoskeletal: no clubbing / cyanosis. Good ROM, no contractures. Normal muscle tone.  Skin: no rashes, lesions, ulcers. Neurologic: CN 2-12 grossly intact. Sensation intact, No focal deficit identified Psychiatric: Alert and oriented x 3. Normal mood.     Condition at discharge: stable  The results of significant diagnostics from this hospitalization (including imaging, microbiology, ancillary and laboratory) are listed below for reference.   Imaging Studies: NM Hepatobiliary Liver Func Result Date: 03/06/2024 EXAM: NM HEPATOBILLARY SCAN 03/06/2024 02:54:04 PM TECHNIQUE: RADIOPHARMACEUTICAL: 7.78 mCi Tc-43m mebrofenin  Dynamic images of the abdomen and pelvis were obtained in the anterior projection for 1 hour after intravenous administration of radiopharmaceutical. 3 mg of morphine  was administered intravenously and imaging continued for at least 30 minutes. COMPARISON: None available. CLINICAL HISTORY: Cholelithiasis, rule out  cholecystitis. FINDINGS: Homogenous uptake within the liver. Normal clearance of the blood pool. Appropriate excretion into the biliary system. At 60 minutes, counts are evident in the small bowel. No gallbladder visualization at 60 minutes. 3 mg of morphine  was administered to augment filling of the gallbladder. The gallbladder fills after morphine  augmentation. IMPRESSION: 1. No acute cholecystitis. Gallbladder fills after morphine  augmentation. Electronically signed by: Norleen Boxer MD 03/06/2024 03:45 PM EST RP Workstation: [id number]   ECHOCARDIOGRAM COMPLETE Result Date: 03/05/2024    ECHOCARDIOGRAM REPORT   Patient Name:   MOHIT ZIRBES Date of Exam: 03/05/2024 Medical Rec #:  [id number]        Height:       73.0 in Accession #:    [id number]       Weight:       515.0 lb Date of Birth:  04-Jan-1976        BSA:          3.216 m Patient Age:    48 years         BP:           128/79 mmHg Patient Gender: M                HR:           73 bpm. Exam Location:  [facility] Procedure: 2D Echo, Cardiac Doppler and Color Doppler (Both Spectral and Color            Flow Doppler were utilized during procedure). Indications:     CHF-acute systolic I50.21  History:         Patient has prior history of Echocardiogram examinations, most                  recent 06/05/2023. Risk Factors:Hypertension. Chronic combined                  diastolic and systolic Congestive heart failure.  Sonographer:     Christopher Furnace Referring Phys:  [id number] XILIN NIU Diagnosing Phys: Marsa Dooms MD  Sonographer Comments: Technically challenging study due to limited acoustic windows, no apical window and no subcostal window. Image acquisition challenging due to patient body habitus. IMPRESSIONS  1. Left ventricular ejection fraction, by estimation, is 40 to 45%. The left ventricle has mildly decreased function. The left ventricle has no regional wall motion abnormalities. Left ventricular diastolic parameters are indeterminate.  2. Right  ventricular systolic function is normal. The right ventricular size is normal.  3. The mitral valve is normal in structure. Trivial mitral valve regurgitation. No evidence of mitral stenosis.  4. The aortic valve is normal in structure. Aortic valve regurgitation is not visualized. No aortic stenosis is present.  5. The inferior vena cava is normal in size with greater than 50% respiratory variability, suggesting right atrial pressure of 3 mmHg. FINDINGS  Left Ventricle: Left ventricular ejection fraction, by estimation, is 40 to 45%. The left ventricle has mildly decreased function. The left ventricle has no regional wall motion abnormalities. Strain was performed and the global longitudinal strain is indeterminate. The left ventricular internal cavity size was normal in size. There is no left ventricular hypertrophy. Left ventricular diastolic parameters are indeterminate. Right Ventricle: The right ventricular size is normal. No increase in right ventricular wall thickness. Right ventricular systolic function is normal. Left Atrium: Left atrial size was normal in size. Right Atrium: Right atrial size was normal in size. Pericardium: There is no evidence of pericardial effusion. Mitral Valve: The mitral valve is normal in structure. Trivial mitral valve regurgitation. No evidence of mitral valve stenosis. Tricuspid Valve: The tricuspid valve is normal in structure. Tricuspid valve regurgitation is trivial. No evidence of tricuspid stenosis. Aortic Valve: The aortic valve is normal in structure. Aortic valve regurgitation is not visualized. No aortic stenosis is present. Pulmonic Valve: The pulmonic valve was normal in structure. Pulmonic valve regurgitation is not visualized. No evidence of pulmonic stenosis. Aorta: The aortic root is normal in size and structure. Venous: The inferior vena cava is normal in size with greater than 50% respiratory variability, suggesting right atrial pressure of 3 mmHg. IAS/Shunts:  No atrial level shunt detected by color flow Doppler. Additional Comments: 3D was performed not requiring image post processing on an independent workstation and was indeterminate.  LEFT VENTRICLE PLAX 2D LVIDd:         5.20 cm LVIDs:         4.05 cm LV PW:         0.90 cm LV IVS:        1.60 cm LVOT diam:     3.55 cm LVOT Area:     9.90 cm  LEFT ATRIUM         Index LA diam:    4.60 cm 1.43 cm/m   AORTA Ao Root diam: 4.30 cm TRICUSPID VALVE TR Peak grad:   20.2 mmHg TR Vmax:        225.00 cm/s  SHUNTS Systemic Diam: 3.55 cm Marsa Dooms MD Electronically signed by Marsa Dooms MD Signature Date/Time: 03/05/2024/1:50:40 PM  Final    US  Abdomen Limited RUQ (LIVER/GB) Result Date: 03/05/2024 CLINICAL DATA:  2 day history of abdominal pain. EXAM: ULTRASOUND ABDOMEN LIMITED RIGHT UPPER QUADRANT COMPARISON:  CT scan from 1 day prior. FINDINGS: Gallbladder: Gallbladder demonstrates an apparent wall echo shadow sign suggesting left the wall is contracted down against intraluminal stones. Gallbladder wall thickness is mildly increased at 3.1 mm. Sonographer reports no sonographic Murphy sign. Common bile duct: Diameter: 4 mm Liver: Increased echogenicity without focal abnormality. Portal vein is patent on color Doppler imaging with normal direction of blood flow towards the liver. Other: None. IMPRESSION: 1. Gallbladder demonstrates an apparent wall echo shadow sign suggesting the wall is contracted down against intraluminal stones. Gallbladder wall thickness is mildly increased at 3.1 mm. Close correlation for signs/symptoms of acute cholecystitis recommended. 2. No biliary dilatation. 3. Increased echogenicity of the liver parenchyma is a nonspecific finding but can be seen in the setting of hepatic steatosis. Electronically Signed   By: Camellia Candle M.D.   On: 03/05/2024 08:46   CT ABDOMEN PELVIS W CONTRAST Result Date: 03/04/2024 EXAM: CT ABDOMEN AND PELVIS WITH CONTRAST 03/04/2024 11:08:52 PM  TECHNIQUE: CT of the abdomen and pelvis was performed with the administration of 125 mL of iohexol  (OMNIPAQUE ) 350 MG/ML injection. Multiplanar reformatted images are provided for review. Automated exposure control, iterative reconstruction, and/or weight-based adjustment of the mA/kV was utilized to reduce the radiation dose to as low as reasonably achievable. COMPARISON: None available. CLINICAL HISTORY: Abdominal pain, acute, nonlocalized. FINDINGS: LOWER CHEST: No acute abnormality. LIVER: Hepatic steatosis. GALLBLADDER AND BILE DUCTS: Cholelithiasis with mild pericholecystic fluid/stranding (image 37), equivocal but at least raising the possibility of acute cholecystitis. No biliary ductal dilatation. SPLEEN: No acute abnormality. PANCREAS: No acute abnormality. ADRENAL GLANDS: No acute abnormality. KIDNEYS, URETERS AND BLADDER: No stones in the kidneys or ureters. No hydronephrosis. No perinephric or periureteral stranding. Urinary bladder is unremarkable. GI AND BOWEL: Stomach demonstrates no acute abnormality. Small periumbilical hernia containing a knuckle of nondilated small bowel (sagittal image 70). There is no bowel obstruction. PERITONEUM AND RETROPERITONEUM: Trace free fluid within the pelvis. No free air. VASCULATURE: Aorta is normal in caliber. LYMPH NODES: Small periaortic nodes, within normal limits. REPRODUCTIVE ORGANS: Prostate is unremarkable. BONES AND SOFT TISSUES: No acute osseous abnormality. Mild body wall edema. Normal appendix (image 73). IMPRESSION: 1. Cholelithiasis with mild pericholecystic fluid/stranding, equivocal but raising the possibility of acute cholecystitis. Consider RUQ ultrasound for further evaluation. 2. Small periumbilical hernia containing a knuckle of nondilated small bowel. No evidence of bowel obstruction. Electronically signed by: Pinkie Pebbles MD 03/04/2024 11:18 PM EST RP Workstation: [id number]   DG Chest 2 View Result Date: 03/04/2024 EXAM: 2 VIEW(S) XRAY  OF THE CHEST 03/04/2024 02:48:33 PM COMPARISON: None available. CLINICAL HISTORY: cp FINDINGS: LUNGS AND PLEURA: No focal pulmonary opacity. No pleural effusion. No pneumothorax. HEART AND MEDIASTINUM: Cardiomegaly. Mild central venous congestion. BONES AND SOFT TISSUES: Multilevel degenerative changes of thoracic spine. IMPRESSION: 1. Cardiomegaly with mild central venous congestion. Electronically signed by: Norleen Boxer MD 03/04/2024 03:44 PM EST RP Workstation: [id number]    Microbiology: Results for orders placed or performed in visit on 11/19/19  Novel Coronavirus, NAA ([facility])     Status: None   Collection Time: 11/19/19 11:00 AM   Specimen: Nasopharyngeal(NP) swabs in vial transport medium   Nasopharynge  Screenin  Result Value Ref Range Status   SARS-CoV-2, NAA Not Detected Not Detected Final    Comment: This nucleic acid amplification test was  developed and its performance characteristics determined by [facility]. Nucleic acid amplification tests include RT-PCR and TMA. This test has not been FDA cleared or approved. This test has been authorized by FDA under an Emergency Use Authorization (EUA). This test is only authorized for the duration of time the declaration that circumstances exist justifying the authorization of the emergency use of in vitro diagnostic tests for detection of SARS-CoV-2 virus and/or diagnosis of COVID-19 infection under section 564(b)(1) of the Act, 21 U.S.C. 639aaa-6(a) (1), unless the authorization is terminated or revoked sooner. When diagnostic testing is negative, the possibility of a false negative result should be considered in the context of a patient's recent exposures and the presence of clinical signs and symptoms consistent with COVID-19. An individual without symptoms of COVID-19 and who is not shedding SARS-CoV-2 virus wo uld expect to have a negative (not detected) result in this assay.   SARS-COV-2, NAA 2 DAY TAT     Status:  None   Collection Time: 11/19/19 11:00 AM   Nasopharynge  Screenin  Result Value Ref Range Status   SARS-CoV-2, NAA 2 DAY TAT Performed  Final    Labs: CBC: Recent Labs  Lab 03/04/24 1417 03/05/24 0554 03/06/24 0510 03/09/24 0354  WBC 4.1 4.3 4.5 4.6  HGB 12.7* 12.5* 12.9* 14.0  HCT 41.5 38.9* 40.3 44.8  MCV 93.0 90.9 90.8 92.8  PLT 169 180 172 209   Basic Metabolic Panel: Recent Labs  Lab 03/04/24 1417 03/05/24 0554 03/07/24 0413 03/09/24 0354  NA 137 138 138 137  K 4.4 3.8 4.0 4.1  CL 99 99 100 98  CO2 27 28 29 30   GLUCOSE 160* 118* 119* 88  BUN 12 11 16 18   CREATININE 1.17 1.16 1.34* 1.21  CALCIUM 8.9 9.1 9.1 9.1  MG  --  1.9  --  2.0   Liver Function Tests: Recent Labs  Lab 03/04/24 1710 03/05/24 1438 03/06/24 0510 03/07/24 0413  AST 31 24 20 20   ALT 20 17 15 13   ALKPHOS 219* 202* 192* 194*  BILITOT 2.4* 2.5* 2.3* 2.4*  PROT 7.5 7.3 7.2 7.4  ALBUMIN 3.6 3.5 3.4* 3.5   CBG: No results for input(s): GLUCAP in the last 168 hours.  Discharge time spent: greater than 30 minutes.  Signed: Nena Rebel, MD [facility] 03/09/2024

## 2024-03-09 NOTE — Progress Notes (Signed)
 [facility]  SUBJECTIVE: Patient laying in bed, reports feeling well, denies chest pain or shortness of breath   Vitals:   03/09/24 0052 03/09/24 0406 03/09/24 0500 03/09/24 0810  BP: 130/83 (!) 145/89  (!) 127/90  Pulse: 68 69  66  Resp: 18 18  16   Temp: 98.6 F (37 C) 97.7 F (36.5 C)  97.9 F (36.6 C)  TempSrc:    Oral  SpO2: 99% 98%  99%  Weight:   (!) 211.5 kg   Height:         Intake/Output Summary (Last 24 hours) at 03/09/2024 9090 Last data filed at 03/08/2024 2000 Gross per 24 hour  Intake 248 ml  Output 700 ml  Net -452 ml      PHYSICAL EXAM  General: Well developed, well nourished, in no acute distress HEENT:  Normocephalic and atramatic Neck:  No JVD.  Lungs: Clear bilaterally to auscultation and percussion. Heart: HRRR . Normal S1 and S2 without gallops or murmurs.  Abdomen: Bowel sounds are positive, abdomen soft and non-tender  Msk:  Back normal, normal gait. Normal strength and tone for age. Extremities: No clubbing, cyanosis or edema.   Neuro: Alert and oriented X 3. Psych:  Good affect, responds appropriately   LABS: Basic Metabolic Panel: Recent Labs    03/07/24 0413 03/09/24 0354  NA 138 137  K 4.0 4.1  CL 100 98  CO2 29 30  GLUCOSE 119* 88  BUN 16 18  CREATININE 1.34* 1.21  CALCIUM 9.1 9.1  MG  --  2.0   Liver Function Tests: Recent Labs    03/07/24 0413  AST 20  ALT 13  ALKPHOS 194*  BILITOT 2.4*  PROT 7.4  ALBUMIN 3.5   No results for input(s): LIPASE, AMYLASE in the last 72 hours. CBC: Recent Labs    03/09/24 0354  WBC 4.6  HGB 14.0  HCT 44.8  MCV 92.8  PLT 209   Cardiac Enzymes: No results for input(s): CKTOTAL, CKMB, CKMBINDEX, TROPONINI in the last 72 hours. BNP: Invalid input(s): POCBNP D-Dimer: No results for input(s): DDIMER in the last 72 hours. Hemoglobin A1C: Recent Labs    03/07/24 1648  HGBA1C 5.2   Fasting Lipid Panel: No results for input(s): CHOL, HDL, LDLCALC,  TRIG, CHOLHDL, LDLDIRECT in the last 72 hours. Thyroid Function Tests: No results for input(s): TSH, T4TOTAL, T3FREE, THYROIDAB in the last 72 hours.  Invalid input(s): FREET3 Anemia Panel: No results for input(s): VITAMINB12, FOLATE, FERRITIN, TIBC, IRON, RETICCTPCT in the last 72 hours.  No results found.   Echo   TELEMETRY: Sinus rhythm 68 bpm:  ASSESSMENT AND PLAN:  Principal Problem:   Acute on chronic combined systolic and diastolic congestive heart failure (HCC) Active Problems:   Morbid obesity with BMI of 60.0-69.9, adult (HCC)   HTN (hypertension)   OSA on CPAP   Abdominal pain   Calculus of gallbladder without cholecystitis without obstruction    1.   Acute on chronic combined systolic and diastolic congestive heart failure, exacerbated by dietary and medical noncompliance, improved after diuresis with IV furosemide  2.  Abdominal pain, suspected acute cholecystitis, HIDA scan negative, no surgical intervention needed 3.  Morbid obesity 4.  Essential hypertension   Recommendations   1.  Agree with current therapy 2.  Continue furosemide  40 mg twice daily 3.  Continue good medical management (carvedilol , irbesartan , hydralazine , spironolactone , and furosemide ) 4.  Stressed the importance of no added salt, low-sodium diet with patient and patient's wife  5.  Stressed the importance of medical compliance 6.  May discharge home today 7.  Follow-up with me in 1 week 8.  Follow-up with [facility] outpatient   Marsa Dooms, MD, PhD, FACC 03/09/2024 9:09 AM
